# Patient Record
Sex: Female | Born: 1935 | Race: White | Hispanic: No | Marital: Married | Smoking: Never smoker
Health system: Southern US, Community
[De-identification: ages and names within clinical notes are randomized; demographics above are authoritative.]

## PROBLEM LIST (undated history)

## (undated) DIAGNOSIS — M1990 Unspecified osteoarthritis, unspecified site: Secondary | ICD-10-CM

## (undated) DIAGNOSIS — Z9889 Other specified postprocedural states: Secondary | ICD-10-CM

## (undated) DIAGNOSIS — I471 Supraventricular tachycardia, unspecified: Secondary | ICD-10-CM

## (undated) DIAGNOSIS — E049 Nontoxic goiter, unspecified: Secondary | ICD-10-CM

## (undated) DIAGNOSIS — G43009 Migraine without aura, not intractable, without status migrainosus: Secondary | ICD-10-CM

## (undated) DIAGNOSIS — C44519 Basal cell carcinoma of skin of other part of trunk: Secondary | ICD-10-CM

## (undated) DIAGNOSIS — B019 Varicella without complication: Secondary | ICD-10-CM

## (undated) DIAGNOSIS — N83209 Unspecified ovarian cyst, unspecified side: Secondary | ICD-10-CM

## (undated) DIAGNOSIS — J302 Other seasonal allergic rhinitis: Secondary | ICD-10-CM

## (undated) DIAGNOSIS — R112 Nausea with vomiting, unspecified: Secondary | ICD-10-CM

## (undated) DIAGNOSIS — G709 Myoneural disorder, unspecified: Secondary | ICD-10-CM

## (undated) DIAGNOSIS — E7800 Pure hypercholesterolemia, unspecified: Secondary | ICD-10-CM

## (undated) DIAGNOSIS — N814 Uterovaginal prolapse, unspecified: Secondary | ICD-10-CM

## (undated) DIAGNOSIS — I10 Essential (primary) hypertension: Secondary | ICD-10-CM

## (undated) DIAGNOSIS — R42 Dizziness and giddiness: Secondary | ICD-10-CM

## (undated) HISTORY — PX: LAPAROSCOPIC OVARIAN CYSTECTOMY: SHX6248

## (undated) HISTORY — DX: Unspecified osteoarthritis, unspecified site: M19.90

## (undated) HISTORY — DX: Migraine without aura, not intractable, without status migrainosus: G43.009

## (undated) HISTORY — DX: Essential (primary) hypertension: I10

## (undated) HISTORY — DX: Other seasonal allergic rhinitis: J30.2

## (undated) HISTORY — DX: Nontoxic goiter, unspecified: E04.9

## (undated) HISTORY — DX: Supraventricular tachycardia: I47.1

## (undated) HISTORY — DX: Uterovaginal prolapse, unspecified: N81.4

## (undated) HISTORY — DX: Pure hypercholesterolemia, unspecified: E78.00

## (undated) HISTORY — PX: CYST EXCISION: SHX5701

## (undated) HISTORY — DX: Dizziness and giddiness: R42

## (undated) HISTORY — DX: Unspecified ovarian cyst, unspecified side: N83.209

## (undated) HISTORY — DX: Varicella without complication: B01.9

## (undated) HISTORY — PX: DILATION AND CURETTAGE OF UTERUS: SHX78

## (undated) HISTORY — PX: BASAL CELL CARCINOMA EXCISION: SHX1214

## (undated) HISTORY — DX: Supraventricular tachycardia, unspecified: I47.10

---
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
## 1970-10-25 HISTORY — PX: COMBINED HYSTEROSCOPY DIAGNOSTIC / D&C: SUR297

## 1993-10-25 HISTORY — PX: AV NODE ABLATION: SHX1209

## 2005-10-25 DIAGNOSIS — N83209 Unspecified ovarian cyst, unspecified side: Secondary | ICD-10-CM

## 2005-10-25 HISTORY — DX: Unspecified ovarian cyst, unspecified side: N83.209

## 2010-10-25 HISTORY — PX: CATARACT EXTRACTION W/ INTRAOCULAR LENS  IMPLANT, BILATERAL: SHX1307

## 2011-02-03 ENCOUNTER — Other Ambulatory Visit (HOSPITAL_COMMUNITY)
Admission: RE | Admit: 2011-02-03 | Discharge: 2011-02-03 | Disposition: A | Discharge status: Home / Self Care | Payer: Medicare Other | Source: Ambulatory Visit | Attending: Obstetrics and Gynecology | Admitting: Obstetrics and Gynecology

## 2011-02-03 DIAGNOSIS — Z124 Encounter for screening for malignant neoplasm of cervix: Secondary | ICD-10-CM | POA: Insufficient documentation

## 2012-08-04 LAB — HM PAP SMEAR: HM Pap smear: NORMAL

## 2012-11-24 ENCOUNTER — Ambulatory Visit (INDEPENDENT_AMBULATORY_CARE_PROVIDER_SITE_OTHER): Payer: Medicare Other | Admitting: Family Medicine

## 2012-11-24 ENCOUNTER — Ambulatory Visit: Payer: Medicare Other | Admitting: Family Medicine

## 2012-11-24 ENCOUNTER — Encounter: Payer: Self-pay | Admitting: Family Medicine

## 2012-11-24 VITALS — BP 132/84 | HR 67 | Temp 97.7°F | Ht 66.0 in | Wt 168.0 lb

## 2012-11-24 DIAGNOSIS — I10 Essential (primary) hypertension: Secondary | ICD-10-CM

## 2012-11-24 DIAGNOSIS — Z7189 Other specified counseling: Secondary | ICD-10-CM

## 2012-11-24 DIAGNOSIS — IMO0002 Reserved for concepts with insufficient information to code with codable children: Secondary | ICD-10-CM

## 2012-11-24 DIAGNOSIS — M1711 Unilateral primary osteoarthritis, right knee: Secondary | ICD-10-CM | POA: Insufficient documentation

## 2012-11-24 DIAGNOSIS — M1710 Unilateral primary osteoarthritis, unspecified knee: Secondary | ICD-10-CM

## 2012-11-24 DIAGNOSIS — E785 Hyperlipidemia, unspecified: Secondary | ICD-10-CM

## 2012-11-24 DIAGNOSIS — I471 Supraventricular tachycardia: Secondary | ICD-10-CM | POA: Insufficient documentation

## 2012-11-24 DIAGNOSIS — R42 Dizziness and giddiness: Secondary | ICD-10-CM

## 2012-11-24 DIAGNOSIS — I498 Other specified cardiac arrhythmias: Secondary | ICD-10-CM

## 2012-11-24 DIAGNOSIS — R739 Hyperglycemia, unspecified: Secondary | ICD-10-CM

## 2012-11-24 DIAGNOSIS — Z7689 Persons encountering health services in other specified circumstances: Secondary | ICD-10-CM

## 2012-11-24 DIAGNOSIS — R7309 Other abnormal glucose: Secondary | ICD-10-CM

## 2012-11-24 DIAGNOSIS — R609 Edema, unspecified: Secondary | ICD-10-CM

## 2012-11-24 LAB — COMPREHENSIVE METABOLIC PANEL
CO2: 26 mEq/L (ref 19–32)
Calcium: 9 mg/dL (ref 8.4–10.5)
Creatinine, Ser: 0.7 mg/dL (ref 0.4–1.2)
GFR: 90.49 mL/min (ref >60.00)
Glucose, Bld: 88 mg/dL (ref 70–99)
Total Bilirubin: 0.5 mg/dL (ref 0.3–1.2)
Total Protein: 6.7 g/dL (ref 6.0–8.3)

## 2012-11-24 LAB — CBC WITH DIFFERENTIAL/PLATELET
Basophils Relative: 0.7 % (ref 0.0–3.0)
Eosinophils Absolute: 0.2 10*3/uL (ref 0.0–0.7)
HCT: 41 % (ref 36.0–46.0)
Lymphs Abs: 2.6 10*3/uL (ref 0.7–4.0)
MCHC: 33.1 g/dL (ref 30.0–36.0)
MCV: 85.7 fl (ref 78.0–100.0)
Monocytes Absolute: 0.7 10*3/uL (ref 0.1–1.0)
Neutro Abs: 4.2 10*3/uL (ref 1.4–7.7)
Neutrophils Relative %: 54.3 % (ref 43.0–77.0)
RBC: 4.79 Mil/uL (ref 3.87–5.11)

## 2012-11-24 LAB — LIPID PANEL
Cholesterol: 265 mg/dL — ABNORMAL HIGH (ref 0–200)
HDL: 46.8 mg/dL (ref >39.00)
VLDL: 28.4 mg/dL (ref 0.0–40.0)

## 2012-11-24 MED ORDER — MECLIZINE HCL 25 MG PO TABS: 25.0000 mg | ORAL_TABLET | Freq: Three times a day (TID) | ORAL | Status: DC | PRN

## 2012-11-24 NOTE — Patient Instructions (Addendum)
-  We have ordered labs or studies at this visit. It can take up to 1-2 weeks for results and processing. We will contact you with instructions IF your results are abnormal. Normal results will be released to your [facility]. If you have not heard from [place] or can not find your results in [facility] in 2 weeks please contact our office.  -We placed a referral for you as discussed to the cardiologist for a pre-op evaluation. It usually takes about 1-2 weeks to process and schedule this referral. If you have not heard from [place] regarding this appointment in 2 weeks please contact our office.   -PLEASE SIGN UP FOR MYCHART TODAY   We recommend the following healthy lifestyle measures: - eat a healthy diet consisting of lots of vegetables, fruits, beans, nuts, seeds, healthy meats such as white chicken and fish and whole grains.  - avoid fried foods, fast food, processed foods, sodas, red meet and other fattening foods.  - get a least 150 minutes of aerobic exercise per week.   Follow up in: 3 months

## 2012-11-24 NOTE — Progress Notes (Signed)
Chief Complaint  Patient presents with  . Establish Care  . Medical Clearance    HPI:  Leslie Burns is here to establish care. Used to see Dr. Ronne Binning - but she was recently discharged from his practice due to a change in her insurance. Last PCP and physical: last follow up with prior PCP in the fall of 2013, had physical in the summer.  Has the following chronic problems and concerns today:  There is no problem list on file for this patient.  She needs a pre-op visit/evaluation for a R knee replacement surgery. History of SVT and ablation in the past. Has been on atenolol since. She had HTN issues in the past. Rarely in the past she does feel some flutter and palpitations, but has been some time since has been symptomatic with this. She did see a cardiologist a few years back. Gets vertigo occ and was told is middle ear issue and uses meclizine - has been using this very rarely - maybe once a month for years. She also used to take medications for cholesterol - but stopped this and she doesn't want to be on a stating. Denies any history of diabetes, lung disease or kidney disease. Reports sedated for recent cataract surgery, and was sick with nausea afterwards, but no major surgeries since childhood.  Health Maintenance: -refuses flu vaccine -had pneumovax last year, doesn't think had tetanus in last ten years -never had a colonoscopy or any type of colon cancer screening -reports last mammogram 6 years ago -had bone density exam last year and report she had osteopenia - take calcium and vitamin   ROS: See pertinent positives and negatives per HPI.  Past Medical History  Diagnosis Date  . Arthritis   . Chicken pox   . Seasonal allergies   . Hypertension     high readings  . High cholesterol   . Ovarian cyst 2007  . SVT (supraventricular tachycardia)   . Vertigo     Family History  Problem Relation Age of Onset  . Stroke Mother 82  . Heart disease Father 15    MI     History   Social History  . Marital Status: Married    Spouse Name: N/A    Number of Children: N/A  . Years of Education: N/A   Social History Main Topics  . Smoking status: Never Smoker   . Smokeless tobacco: None  . Alcohol Use: No  . Drug Use:   . Sexually Active:    Other Topics Concern  . None   Social History Narrative   Work or School: homemakerHome Situation: lives with husband who has parkinson and dementia - she is the sole caregiverSpiritual Beliefs: christianLifestyle: was exercising until the summer - walks about 1/3 of a mile on a regular basis, only limited by knee pain, can walk of a flight of steps without any SOB    Current outpatient prescriptions:atenolol (TENORMIN) 25 MG tablet, Take 25 mg by mouth daily., Disp: , Rfl: ;  meclizine (ANTIVERT) 25 MG tablet, Take 1 tablet (25 mg total) by mouth 3 (three) times daily as needed., Disp: 30 tablet, Rfl: 0  EXAM:  Filed Vitals:   11/24/12 1253  BP: 132/84  Pulse: 67  Temp: 97.7 F (36.5 C)    Body mass index is 27.12 kg/(m^2).  GENERAL: vitals reviewed and listed above, alert, oriented, appears well hydrated and in no acute distress  HEENT: atraumatic, conjunttiva clear, no obvious abnormalities on inspection of external  nose and ears  NECK: no obvious masses on inspection, no JVD, no bruit  LUNGS: clear to auscultation bilaterally, no wheezes, rales or rhonchi, good air movement  CV: HRRR, 1+ pitting LE edema  MS: moves all extremities without noticeable abnormality  PSYCH: pleasant and cooperative, no obvious depression or anxiety  ASSESSMENT AND PLAN:  Discussed the following assessment and plan:  1. SVT (supraventricular tachycardia)  Ambulatory referral to Cardiology  2. Hypertension  Ambulatory referral to Cardiology, CMP  3. Edema  Ambulatory referral to Cardiology, CMP  4. Osteoarthritis of right knee  Ambulatory referral to Cardiology  5. Vertigo  Ambulatory referral to  Cardiology, CBC with Differential, meclizine (ANTIVERT) 25 MG tablet  6. Hyperlipemia  Lipid Panel  7. Establishing care with new doctor, encounter for  Hemoglobin A1c, CBC with Differential  8. Hyperglycemia  Hemoglobin A1c   -We reviewed the PMH, PSH, FH, SH, Meds and Allergies. -We provided refills for any medications we will prescribe as needed. -Hx of multiple CV issues including HTN, untreated HLD, SVT s/p ablation and FH of CV disease with occ vertigo and palpitations. She also has LE edema which she reports she did not know she had. Advised she see cardiology for a pre-op assessment/eval prior to her knee replacement surgery. -NON-FASTING LABS today -discussed preventive recommendations - she does not want to do a mammogram, colon cancer screening or vaccines -follow up in 3 months  -Patient advised to return or notify a doctor immediately if symptoms worsen or persist or new concerns arise.  Patient Instructions  -We have ordered labs or studies at this visit. It can take up to 1-2 weeks for results and processing. We will contact you with instructions IF your results are abnormal. Normal results will be released to your [facility]. If you have not heard from [place] or can not find your results in [facility] in 2 weeks please contact our office.  -We placed a referral for you as discussed to the cardiologist for a pre-op evaluation. It usually takes about 1-2 weeks to process and schedule this referral. If you have not heard from [place] regarding this appointment in 2 weeks please contact our office.   -PLEASE SIGN UP FOR MYCHART TODAY   We recommend the following healthy lifestyle measures: - eat a healthy diet consisting of lots of vegetables, fruits, beans, nuts, seeds, healthy meats such as white chicken and fish and whole grains.  - avoid fried foods, fast food, processed foods, sodas, red meet and other fattening foods.  - get a least 150 minutes of aerobic exercise per week.   Follow up  in: 3 months      Treena Cosman R.

## 2012-11-25 ENCOUNTER — Telehealth: Payer: Self-pay | Admitting: Family Medicine

## 2012-11-25 NOTE — Telephone Encounter (Signed)
Please let her know:  Cholesterol is high - she can discuss with the cardiologist or with me at follow up  Otherwise, labs look ok.

## 2012-11-28 ENCOUNTER — Encounter: Payer: Self-pay | Admitting: Family Medicine

## 2012-11-28 NOTE — Telephone Encounter (Signed)
Left message on voicemail.

## 2012-11-29 NOTE — Telephone Encounter (Signed)
Called and spoke with pt and pt is aware.  Pt request a copy be sent to home address (address verified).

## 2012-11-29 NOTE — Telephone Encounter (Signed)
Called and spoke with pt and pt is aware.  

## 2012-12-06 ENCOUNTER — Encounter: Payer: Self-pay | Admitting: Internal Medicine

## 2012-12-06 ENCOUNTER — Ambulatory Visit (INDEPENDENT_AMBULATORY_CARE_PROVIDER_SITE_OTHER): Payer: Medicare Other | Admitting: Internal Medicine

## 2012-12-06 VITALS — BP 168/78 | HR 65 | Ht 66.0 in | Wt 168.2 lb

## 2012-12-06 DIAGNOSIS — Z01810 Encounter for preprocedural cardiovascular examination: Secondary | ICD-10-CM

## 2012-12-06 DIAGNOSIS — I498 Other specified cardiac arrhythmias: Secondary | ICD-10-CM

## 2012-12-06 DIAGNOSIS — I4949 Other premature depolarization: Secondary | ICD-10-CM

## 2012-12-06 DIAGNOSIS — I471 Supraventricular tachycardia, unspecified: Secondary | ICD-10-CM

## 2012-12-06 DIAGNOSIS — I493 Ventricular premature depolarization: Secondary | ICD-10-CM | POA: Insufficient documentation

## 2012-12-06 NOTE — Assessment & Plan Note (Signed)
Status postcatheter ablation 1990s without clinical recurrences this issue is resolved

## 2012-12-06 NOTE — Assessment & Plan Note (Signed)
Patient's surgery is intermediate risk. Her functional status is good. She is on beta blockers. No history of coronary disease. Her surgical risks should be acceptable. We will be available.

## 2012-12-06 NOTE — Assessment & Plan Note (Signed)
Asymptomatic continue beta blocker

## 2012-12-06 NOTE — Progress Notes (Signed)
ELECTROPHYSIOLOGY CONSULT NOTE  Patient ID: Leslie Burns, MRN: [id number], DOB/AGE: Jun 28, 1933 79 y.o. Admit date: (Not on file) Date of Consult: 12/06/2012  Primary Physician: Terressa Koyanagi., DO Primary Cardiologist: new   Chief Complaint:  preop clearance    HPI Leslie Burns is a 79 y.o. female  Seen for preoperative clearance. She is to have knee replacement surgery. She is functionally quite able. She is able to climb stairs, empty  car groceries. She has no complaints of chest pain or shortness of breath with this.   She is s/p AV node reentry tachycardia catheter ablation done at [facility] in the 1990s without recurrent tachycardia arrhythmia. She has had palpitations. Holter monitoring at [facility] demonstrated PACs and PVCs and she has been treated long-term with beta blockers because of this.     Past Medical History  Diagnosis Date  . Arthritis   . Chicken pox   . Seasonal allergies   . Hypertension     high readings  . High cholesterol   . Ovarian cyst 2007  . SVT (supraventricular tachycardia)   . Vertigo       Surgical History:  Past Surgical History  Procedure Laterality Date  . Tonsillectomy and adenoidectomy  1945  . Combined hysteroscopy diagnostic / d&c  1958  . Combined hysteroscopy diagnostic / d&c  1972    tubal pregnancy  . Av nodel ablation  1995  . Double cataract surgery  2012     Home Meds: Prior to Admission medications   Medication Sig Start Date End Date Taking? Authorizing Provider  atenolol (TENORMIN) 25 MG tablet Take 25 mg by mouth daily.   Yes Historical Provider, MD  meclizine (ANTIVERT) 25 MG tablet Take 1 tablet (25 mg total) by mouth 3 (three) times daily as needed. 11/24/12  Yes Terressa Koyanagi, DO   Allergies:  Allergies  Allergen Reactions  . Azithromycin     History   Social History  . Marital Status: Married    Spouse Name: N/A    Number of Children: N/A  . Years of Education: N/A   Occupational History  . Not on  file.   Social History Main Topics  . Smoking status: Never Smoker   . Smokeless tobacco: Not on file  . Alcohol Use: No  . Drug Use:   . Sexually Active:    Other Topics Concern  . Not on file   Social History Narrative   Work or School: homemaker      Home Situation: lives with husband who has parkinson and dementia - she is the sole caregiver      Spiritual Beliefs: christian      Lifestyle: was exercising until the summer - walks about 1/3 of a mile on a regular basis, only limited by knee pain, can walk of a flight of steps without any SOB              Family History  Problem Relation Age of Onset  . Stroke Mother 31  . Heart disease Father 43    MI  . Alcohol abuse      parent  . Alcohol abuse      grandparent  . Arthritis      parent  . Hyperlipidemia      parent  . Hypertension      parent     ROS:  Please see the history of present illness.     All other systems reviewed and negative.  Physical Exam:   Blood pressure 146/67, pulse 62, height 5\' 6"  (1.676 m), weight 168 lb 3.2 oz (76.295 kg). General: Well developed, well nourished female in no acute distress. Head: Normocephalic, atraumatic, sclera non-icteric, no xanthomas, nares are without discharge. Lymph Nodes:  none Back: without scoliosis/kyphosis , no CVA tendersness Neck: Negative for carotid bruits. JVD not elevated. Lungs: Clear bilaterally to auscultation without wheezes, rales, or rhonchi. Breathing is unlabored. Heart: RRR with S1 S2. No  murmur , rubs, or gallops appreciated. Abdomen: Soft, non-tender, non-distended with normoactive bowel sounds. No hepatomegaly. No rebound/guarding. No obvious abdominal masses. Msk:  Strength and tone appear normal for age. Extremities: No clubbing or cyanosis. No  edema.  Distal pedal pulses are 2+ and equal bilaterally. Wearing a brace on ight knee and I Skin: Warm and Dry Neuro: Alert and oriented X 3. CN III-XII intact Grossly normal sensory and  motor function . Psych:  Responds to questions appropriately with a normal affect.      Labs: Cardiac Enzymes No results found for this basename: CKTOTAL, CKMB, TROPONINI,  in the last 72 hours CBC Lab Results  Component Value Date   WBC 7.8 11/24/2012   HGB 13.6 11/24/2012   HCT 41.0 11/24/2012   MCV 85.7 11/24/2012   PLT 266.0 11/24/2012   PROTIME: No results found for this basename: LABPROT, INR,  in the last 72 hours Chemistry No results found for this basename: NA, K, CL, CO2, BUN, CREATININE, CALCIUM, LABALBU, PROT, BILITOT, ALKPHOS, ALT, AST, GLUCOSE,  in the last 168 hours Lipids Lab Results  Component Value Date   CHOL 265* 11/24/2012   HDL 46.80 11/24/2012   TRIG 142.0 11/24/2012   BNP No results found for this basename: probnp   Miscellaneous No results found for this basename: DDIMER    Radiology/Studies:  No results found.  EKG:  Of sinus rhythm at 58 14/15/44 Axis is t normal at 47   Assessment and Plan:    Sherryl Manges

## 2012-12-20 ENCOUNTER — Encounter: Payer: Self-pay | Admitting: Family Medicine

## 2012-12-20 NOTE — Progress Notes (Signed)
OV notes from [facility] scanned in. Health maintenance updated.

## 2013-01-02 ENCOUNTER — Encounter: Payer: Self-pay | Admitting: Family Medicine

## 2013-01-08 ENCOUNTER — Encounter (HOSPITAL_COMMUNITY): Payer: Self-pay | Admitting: Pharmacy Technician

## 2013-01-12 ENCOUNTER — Other Ambulatory Visit: Payer: Self-pay | Admitting: Orthopedic Surgery

## 2013-01-13 NOTE — Pre-Procedure Instructions (Signed)
Leslie Burns  01/13/2013   Your procedure is scheduled on:  Mon, Mar 31 @ 7:15 AM  Report to [facility] at 5:30 AM.  Call this number if you have problems the morning of surgery: [phone]   Remember:   Do not eat food or drink liquids after midnight.   Take these medicines the morning of surgery with A SIP OF WATER: Atenolol(Tenormin) and Antivert(Meclizine--if needed)   Do not wear jewelry, make-up or nail polish.  Do not wear lotions, powders, or perfumes. You may wear deodorant.  Do not shave 48 hours prior to surgery.   Do not bring valuables to the hospital.  Contacts, dentures or bridgework may not be worn into surgery.  Leave suitcase in the car. After surgery it may be brought to your room.  For patients admitted to the hospital, checkout time is 11:00 AM the day of  discharge.   Patients discharged the day of surgery will not be allowed to drive  home.    Special Instructions: Shower using CHG 2 nights before surgery and the night before surgery.  If you shower the day of surgery use CHG.  Use special wash - you have one bottle of CHG for all showers.  You should use approximately 1/3 of the bottle for each shower.   Please read over the following fact sheets that you were given: Pain Booklet, Coughing and Deep Breathing, Blood Transfusion Information, Total Joint Packet, MRSA Information and Surgical Site Infection Prevention

## 2013-01-15 ENCOUNTER — Encounter (HOSPITAL_COMMUNITY): Payer: Self-pay

## 2013-01-15 ENCOUNTER — Encounter (HOSPITAL_COMMUNITY)
Admission: RE | Admit: 2013-01-15 | Discharge: 2013-01-15 | Disposition: A | Discharge status: Home / Self Care | Payer: Medicare Other | Source: Ambulatory Visit | Attending: Orthopedic Surgery | Admitting: Orthopedic Surgery

## 2013-01-15 HISTORY — DX: Myoneural disorder, unspecified: G70.9

## 2013-01-15 HISTORY — DX: Nausea with vomiting, unspecified: R11.2

## 2013-01-15 HISTORY — DX: Other specified postprocedural states: Z98.890

## 2013-01-15 LAB — PROTIME-INR: INR: 1.05 (ref 0.00–1.49)

## 2013-01-15 LAB — URINALYSIS, ROUTINE W REFLEX MICROSCOPIC
Glucose, UA: NEGATIVE mg/dL
Ketones, ur: NEGATIVE mg/dL
Nitrite: NEGATIVE
Specific Gravity, Urine: 1.007 (ref 1.005–1.030)
pH: 7 (ref 5.0–8.0)

## 2013-01-15 LAB — COMPREHENSIVE METABOLIC PANEL
ALT: 10 U/L (ref 0–35)
AST: 13 U/L (ref 0–37)
Albumin: 3.6 g/dL (ref 3.5–5.2)
CO2: 26 mEq/L (ref 19–32)
Calcium: 9.2 mg/dL (ref 8.4–10.5)
Chloride: 106 mEq/L (ref 96–112)
Creatinine, Ser: 0.67 mg/dL (ref 0.50–1.10)
GFR calc non Af Amer: 82 mL/min — ABNORMAL LOW (ref >90)
Sodium: 141 mEq/L (ref 135–145)
Total Bilirubin: 0.3 mg/dL (ref 0.3–1.2)

## 2013-01-15 LAB — SURGICAL PCR SCREEN
MRSA, PCR: NEGATIVE
Staphylococcus aureus: NEGATIVE

## 2013-01-15 LAB — CBC WITH DIFFERENTIAL/PLATELET
Basophils Absolute: 0 10*3/uL (ref 0.0–0.1)
Basophils Relative: 1 % (ref 0–1)
Lymphocytes Relative: 43 % (ref 12–46)
MCHC: 33.5 g/dL (ref 30.0–36.0)
Neutro Abs: 3.5 10*3/uL (ref 1.7–7.7)
Platelets: 250 10*3/uL (ref 150–400)
RDW: 13.1 % (ref 11.5–15.5)
WBC: 7.4 10*3/uL (ref 4.0–10.5)

## 2013-01-15 LAB — APTT: aPTT: 30 seconds (ref 24–37)

## 2013-01-15 LAB — URINE MICROSCOPIC-ADD ON

## 2013-01-15 LAB — ABO/RH: ABO/RH(D): O POS

## 2013-01-15 LAB — TYPE AND SCREEN

## 2013-01-17 LAB — URINE CULTURE: Colony Count: >=100000

## 2013-01-21 MED ORDER — CEFAZOLIN SODIUM-DEXTROSE 2-3 GM-% IV SOLR
2.0000 g | INTRAVENOUS | Status: AC
  Administered 2013-01-22: 2 g via INTRAVENOUS
  Filled 2013-01-21: qty 50

## 2013-01-22 ENCOUNTER — Encounter (HOSPITAL_COMMUNITY): Payer: Self-pay

## 2013-01-22 ENCOUNTER — Inpatient Hospital Stay (HOSPITAL_COMMUNITY)
Admission: RE | Admit: 2013-01-22 | Discharge: 2013-01-23 | DRG: 470 | Disposition: A | Discharge status: Home Health | Payer: Medicare Other | Source: Ambulatory Visit | Attending: Orthopedic Surgery | Admitting: Orthopedic Surgery

## 2013-01-22 ENCOUNTER — Encounter (HOSPITAL_COMMUNITY): Payer: Self-pay | Admitting: Certified Registered"

## 2013-01-22 ENCOUNTER — Encounter (HOSPITAL_COMMUNITY)
Admission: RE | Disposition: A | Discharge status: Home Health | Payer: Self-pay | Source: Ambulatory Visit | Attending: Orthopedic Surgery

## 2013-01-22 ENCOUNTER — Inpatient Hospital Stay (HOSPITAL_COMMUNITY): Payer: Medicare Other | Admitting: Certified Registered"

## 2013-01-22 DIAGNOSIS — E7800 Pure hypercholesterolemia, unspecified: Secondary | ICD-10-CM | POA: Diagnosis present

## 2013-01-22 DIAGNOSIS — Z823 Family history of stroke: Secondary | ICD-10-CM

## 2013-01-22 DIAGNOSIS — Z6379 Other stressful life events affecting family and household: Secondary | ICD-10-CM

## 2013-01-22 DIAGNOSIS — Z888 Allergy status to other drugs, medicaments and biological substances status: Secondary | ICD-10-CM

## 2013-01-22 DIAGNOSIS — Z7901 Long term (current) use of anticoagulants: Secondary | ICD-10-CM

## 2013-01-22 DIAGNOSIS — Z881 Allergy status to other antibiotic agents status: Secondary | ICD-10-CM

## 2013-01-22 DIAGNOSIS — Z8249 Family history of ischemic heart disease and other diseases of the circulatory system: Secondary | ICD-10-CM

## 2013-01-22 DIAGNOSIS — M1710 Unilateral primary osteoarthritis, unspecified knee: Principal | ICD-10-CM | POA: Diagnosis present

## 2013-01-22 DIAGNOSIS — M179 Osteoarthritis of knee, unspecified: Secondary | ICD-10-CM

## 2013-01-22 DIAGNOSIS — Z8261 Family history of arthritis: Secondary | ICD-10-CM

## 2013-01-22 DIAGNOSIS — Z01812 Encounter for preprocedural laboratory examination: Secondary | ICD-10-CM

## 2013-01-22 DIAGNOSIS — Z79899 Other long term (current) drug therapy: Secondary | ICD-10-CM

## 2013-01-22 DIAGNOSIS — I10 Essential (primary) hypertension: Secondary | ICD-10-CM | POA: Diagnosis present

## 2013-01-22 DIAGNOSIS — D62 Acute posthemorrhagic anemia: Secondary | ICD-10-CM | POA: Diagnosis not present

## 2013-01-22 DIAGNOSIS — I491 Atrial premature depolarization: Secondary | ICD-10-CM | POA: Diagnosis present

## 2013-01-22 HISTORY — PX: TOTAL KNEE ARTHROPLASTY: SHX125

## 2013-01-22 HISTORY — DX: Basal cell carcinoma of skin of other part of trunk: C44.519

## 2013-01-22 LAB — CREATININE, SERUM
GFR calc Af Amer: >90 mL/min (ref >90)
GFR calc non Af Amer: 84 mL/min — ABNORMAL LOW (ref >90)

## 2013-01-22 LAB — CBC
HCT: 39.1 % (ref 36.0–46.0)
Hemoglobin: 13.2 g/dL (ref 12.0–15.0)
MCV: 84.1 fL (ref 78.0–100.0)
RBC: 4.65 MIL/uL (ref 3.87–5.11)
RDW: 13.1 % (ref 11.5–15.5)
WBC: 15.2 10*3/uL — ABNORMAL HIGH (ref 4.0–10.5)

## 2013-01-22 SURGERY — ARTHROPLASTY, KNEE, TOTAL
Anesthesia: General | Site: Knee | Laterality: Right | Wound class: Clean

## 2013-01-22 MED ORDER — FENTANYL CITRATE 0.05 MG/ML IJ SOLN: 50.0000 ug | Freq: Once | INTRAMUSCULAR | Status: DC

## 2013-01-22 MED ORDER — CEFAZOLIN SODIUM 1-5 GM-% IV SOLN
1.0000 g | Freq: Four times a day (QID) | INTRAVENOUS | Status: AC
  Administered 2013-01-22 (×2): 1 g via INTRAVENOUS
  Filled 2013-01-22 (×2): qty 50

## 2013-01-22 MED ORDER — HYDROMORPHONE HCL PF 1 MG/ML IJ SOLN: 0.2500 mg | INTRAMUSCULAR | Status: DC | PRN

## 2013-01-22 MED ORDER — SODIUM CHLORIDE 0.9 % IV SOLN: INTRAVENOUS | Status: DC

## 2013-01-22 MED ORDER — MIDAZOLAM HCL 2 MG/2ML IJ SOLN: 1.0000 mg | INTRAMUSCULAR | Status: DC | PRN

## 2013-01-22 MED ORDER — BUPIVACAINE-EPINEPHRINE PF 0.5-1:200000 % IJ SOLN
INTRAMUSCULAR | Status: DC | PRN
  Administered 2013-01-22: 25 mL

## 2013-01-22 MED ORDER — SENNOSIDES-DOCUSATE SODIUM 8.6-50 MG PO TABS: 1.0000 | ORAL_TABLET | Freq: Every evening | ORAL | Status: DC | PRN

## 2013-01-22 MED ORDER — BUPIVACAINE-EPINEPHRINE 0.25% -1:200000 IJ SOLN
INTRAMUSCULAR | Status: AC
  Filled 2013-01-22: qty 1

## 2013-01-22 MED ORDER — PROPOFOL 10 MG/ML IV BOLUS
INTRAVENOUS | Status: DC | PRN
  Administered 2013-01-22: 140 mg via INTRAVENOUS

## 2013-01-22 MED ORDER — ATENOLOL 25 MG PO TABS
25.0000 mg | ORAL_TABLET | Freq: Every day | ORAL | Status: DC
  Administered 2013-01-23: 25 mg via ORAL
  Filled 2013-01-22: qty 1

## 2013-01-22 MED ORDER — PHENYLEPHRINE HCL 10 MG/ML IJ SOLN
INTRAMUSCULAR | Status: DC | PRN
  Administered 2013-01-22 (×2): 80 ug via INTRAVENOUS

## 2013-01-22 MED ORDER — BUPIVACAINE 0.25 % ON-Q PUMP SINGLE CATH 300ML
300.0000 mL | INJECTION | Status: DC
  Filled 2013-01-22 (×2): qty 300

## 2013-01-22 MED ORDER — LACTATED RINGERS IV SOLN
INTRAVENOUS | Status: DC | PRN
  Administered 2013-01-22 (×2): via INTRAVENOUS

## 2013-01-22 MED ORDER — DEXAMETHASONE SODIUM PHOSPHATE 10 MG/ML IJ SOLN
10.0000 mg | Freq: Three times a day (TID) | INTRAMUSCULAR | Status: AC
  Administered 2013-01-22: 10 mg via INTRAVENOUS
  Filled 2013-01-22 (×3): qty 1

## 2013-01-22 MED ORDER — SCOPOLAMINE 1 MG/3DAYS TD PT72
MEDICATED_PATCH | TRANSDERMAL | Status: AC
  Administered 2013-01-22: 1 via TRANSDERMAL
  Filled 2013-01-22: qty 1

## 2013-01-22 MED ORDER — PROMETHAZINE HCL 25 MG/ML IJ SOLN
6.2500 mg | INTRAMUSCULAR | Status: DC | PRN
  Administered 2013-01-22: 6.25 mg via INTRAVENOUS

## 2013-01-22 MED ORDER — BUPIVACAINE-EPINEPHRINE 0.25% -1:200000 IJ SOLN
INTRAMUSCULAR | Status: DC | PRN
  Administered 2013-01-22: 25 mL

## 2013-01-22 MED ORDER — ACETAMINOPHEN 10 MG/ML IV SOLN
INTRAVENOUS | Status: AC
  Administered 2013-01-22: 1000 mg via INTRAVENOUS
  Filled 2013-01-22: qty 100

## 2013-01-22 MED ORDER — METOCLOPRAMIDE HCL 5 MG/ML IJ SOLN
5.0000 mg | Freq: Three times a day (TID) | INTRAMUSCULAR | Status: DC | PRN
  Administered 2013-01-22: 10 mg via INTRAVENOUS
  Filled 2013-01-22 (×2): qty 2

## 2013-01-22 MED ORDER — ZOLPIDEM TARTRATE 5 MG PO TABS: 5.0000 mg | ORAL_TABLET | Freq: Every evening | ORAL | Status: DC | PRN

## 2013-01-22 MED ORDER — CHLORHEXIDINE GLUCONATE 4 % EX LIQD: 60.0000 mL | Freq: Once | CUTANEOUS | Status: DC

## 2013-01-22 MED ORDER — ONDANSETRON HCL 4 MG PO TABS
4.0000 mg | ORAL_TABLET | Freq: Four times a day (QID) | ORAL | Status: DC | PRN
  Administered 2013-01-22: 4 mg via ORAL
  Filled 2013-01-22: qty 1

## 2013-01-22 MED ORDER — SCOPOLAMINE 1 MG/3DAYS TD PT72: 1.0000 | MEDICATED_PATCH | Freq: Once | TRANSDERMAL | Status: DC

## 2013-01-22 MED ORDER — SODIUM CHLORIDE 0.9 % IR SOLN
Status: DC | PRN
  Administered 2013-01-22: 1000 mL
  Administered 2013-01-22: 3000 mL

## 2013-01-22 MED ORDER — PHENOL 1.4 % MT LIQD: 1.0000 | OROMUCOSAL | Status: DC | PRN

## 2013-01-22 MED ORDER — MENTHOL 3 MG MT LOZG: 1.0000 | LOZENGE | OROMUCOSAL | Status: DC | PRN

## 2013-01-22 MED ORDER — ACETAMINOPHEN 10 MG/ML IV SOLN
1000.0000 mg | Freq: Four times a day (QID) | INTRAVENOUS | Status: AC
Start: 2013-01-22 — End: 2013-01-23
  Administered 2013-01-22 – 2013-01-23 (×4): 1000 mg via INTRAVENOUS
  Filled 2013-01-22 (×5): qty 100

## 2013-01-22 MED ORDER — OXYCODONE HCL 5 MG PO TABS
5.0000 mg | ORAL_TABLET | ORAL | Status: DC | PRN
  Administered 2013-01-23: 5 mg via ORAL
  Filled 2013-01-22: qty 1

## 2013-01-22 MED ORDER — ONDANSETRON HCL 4 MG/2ML IJ SOLN
INTRAMUSCULAR | Status: DC | PRN
  Administered 2013-01-22: 4 mg via INTRAVENOUS

## 2013-01-22 MED ORDER — CELECOXIB 200 MG PO CAPS
200.0000 mg | ORAL_CAPSULE | Freq: Two times a day (BID) | ORAL | Status: DC
  Administered 2013-01-22 – 2013-01-23 (×2): 200 mg via ORAL
  Filled 2013-01-22 (×4): qty 1

## 2013-01-22 MED ORDER — ACETAMINOPHEN 650 MG RE SUPP: 650.0000 mg | Freq: Four times a day (QID) | RECTAL | Status: DC | PRN

## 2013-01-22 MED ORDER — ENOXAPARIN SODIUM 30 MG/0.3ML ~~LOC~~ SOLN
30.0000 mg | Freq: Two times a day (BID) | SUBCUTANEOUS | Status: DC
  Administered 2013-01-23: 30 mg via SUBCUTANEOUS
  Filled 2013-01-22 (×3): qty 0.3

## 2013-01-22 MED ORDER — MIDAZOLAM HCL 5 MG/5ML IJ SOLN
INTRAMUSCULAR | Status: DC | PRN
  Administered 2013-01-22 (×2): 1 mg via INTRAVENOUS

## 2013-01-22 MED ORDER — METOCLOPRAMIDE HCL 5 MG PO TABS
5.0000 mg | ORAL_TABLET | Freq: Three times a day (TID) | ORAL | Status: DC | PRN
  Filled 2013-01-22: qty 2

## 2013-01-22 MED ORDER — PROMETHAZINE HCL 25 MG/ML IJ SOLN
INTRAMUSCULAR | Status: AC
  Filled 2013-01-22: qty 1

## 2013-01-22 MED ORDER — MECLIZINE HCL 12.5 MG PO TABS
12.5000 mg | ORAL_TABLET | Freq: Three times a day (TID) | ORAL | Status: DC | PRN
  Filled 2013-01-22: qty 1

## 2013-01-22 MED ORDER — DOCUSATE SODIUM 100 MG PO CAPS
100.0000 mg | ORAL_CAPSULE | Freq: Two times a day (BID) | ORAL | Status: DC
  Administered 2013-01-22 – 2013-01-23 (×2): 100 mg via ORAL
  Filled 2013-01-22 (×2): qty 1

## 2013-01-22 MED ORDER — SUFENTANIL CITRATE 50 MCG/ML IV SOLN
INTRAVENOUS | Status: DC | PRN
  Administered 2013-01-22: 10 ug via INTRAVENOUS
  Administered 2013-01-22 (×3): 5 ug via INTRAVENOUS

## 2013-01-22 MED ORDER — METHOCARBAMOL 500 MG PO TABS
500.0000 mg | ORAL_TABLET | Freq: Four times a day (QID) | ORAL | Status: DC | PRN
  Administered 2013-01-22 – 2013-01-23 (×2): 500 mg via ORAL
  Filled 2013-01-22 (×3): qty 1

## 2013-01-22 MED ORDER — DEXAMETHASONE 6 MG PO TABS
10.0000 mg | ORAL_TABLET | Freq: Three times a day (TID) | ORAL | Status: AC
  Administered 2013-01-22 – 2013-01-23 (×2): 10 mg via ORAL
  Filled 2013-01-22 (×3): qty 1

## 2013-01-22 MED ORDER — ALUM & MAG HYDROXIDE-SIMETH 200-200-20 MG/5ML PO SUSP: 30.0000 mL | ORAL | Status: DC | PRN

## 2013-01-22 MED ORDER — ACETAMINOPHEN 325 MG PO TABS: 650.0000 mg | ORAL_TABLET | Freq: Four times a day (QID) | ORAL | Status: DC | PRN

## 2013-01-22 MED ORDER — BUPIVACAINE 0.25 % ON-Q PUMP SINGLE CATH 300ML: INJECTION | Status: DC | PRN

## 2013-01-22 MED ORDER — METHOCARBAMOL 100 MG/ML IJ SOLN
500.0000 mg | Freq: Four times a day (QID) | INTRAVENOUS | Status: DC | PRN
  Administered 2013-01-22: 500 mg via INTRAVENOUS
  Filled 2013-01-22 (×2): qty 5

## 2013-01-22 MED ORDER — LIDOCAINE HCL (CARDIAC) 20 MG/ML IV SOLN
INTRAVENOUS | Status: DC | PRN
  Administered 2013-01-22: 60 mg via INTRAVENOUS

## 2013-01-22 MED ORDER — ONDANSETRON HCL 4 MG/2ML IJ SOLN
4.0000 mg | Freq: Four times a day (QID) | INTRAMUSCULAR | Status: DC | PRN
  Administered 2013-01-22: 4 mg via INTRAVENOUS
  Filled 2013-01-22: qty 2

## 2013-01-22 MED ORDER — HYDROMORPHONE HCL PF 1 MG/ML IJ SOLN
1.0000 mg | INTRAMUSCULAR | Status: DC | PRN
  Administered 2013-01-22 – 2013-01-23 (×2): 1 mg via INTRAVENOUS
  Filled 2013-01-22 (×2): qty 1

## 2013-01-22 MED ORDER — FLEET ENEMA 7-19 GM/118ML RE ENEM: 1.0000 | ENEMA | Freq: Once | RECTAL | Status: AC | PRN

## 2013-01-22 MED ORDER — BISACODYL 5 MG PO TBEC: 5.0000 mg | DELAYED_RELEASE_TABLET | Freq: Every day | ORAL | Status: DC | PRN

## 2013-01-22 MED ORDER — DIPHENHYDRAMINE HCL 12.5 MG/5ML PO ELIX: 12.5000 mg | ORAL_SOLUTION | ORAL | Status: DC | PRN

## 2013-01-22 MED ORDER — GLYCOPYRROLATE 0.2 MG/ML IJ SOLN
INTRAMUSCULAR | Status: DC | PRN
  Administered 2013-01-22 (×2): 0.2 mg via INTRAVENOUS

## 2013-01-22 SURGICAL SUPPLY — 57 items
BANDAGE ESMARK 6X9 LF (GAUZE/BANDAGES/DRESSINGS) ×1 IMPLANT
BLADE SAGITTAL 13X1.27X60 (BLADE) ×2 IMPLANT
BLADE SAW SGTL 83.5X18.5 (BLADE) ×2 IMPLANT
BNDG ESMARK 6X9 LF (GAUZE/BANDAGES/DRESSINGS) ×2
BOWL SMART MIX CTS (DISPOSABLE) ×2 IMPLANT
CATH KIT ON Q 5IN SLV (PAIN MANAGEMENT) ×2 IMPLANT
CEMENT BONE SIMPLEX SPEEDSET (Cement) ×4 IMPLANT
CLOTH BEACON ORANGE TIMEOUT ST (SAFETY) ×2 IMPLANT
COVER SURGICAL LIGHT HANDLE (MISCELLANEOUS) ×2 IMPLANT
CUFF TOURNIQUET SINGLE 34IN LL (TOURNIQUET CUFF) ×2 IMPLANT
DECANTER SPIKE VIAL GLASS SM (MISCELLANEOUS) ×2 IMPLANT
DRAPE EXTREMITY T 121X128X90 (DRAPE) ×2 IMPLANT
DRAPE INCISE IOBAN 66X45 STRL (DRAPES) ×4 IMPLANT
DRAPE PROXIMA HALF (DRAPES) ×2 IMPLANT
DRAPE U-SHAPE 47X51 STRL (DRAPES) ×2 IMPLANT
DRSG ADAPTIC 3X8 NADH LF (GAUZE/BANDAGES/DRESSINGS) ×2 IMPLANT
DRSG PAD ABDOMINAL 8X10 ST (GAUZE/BANDAGES/DRESSINGS) ×2 IMPLANT
DURAPREP 26ML APPLICATOR (WOUND CARE) ×4 IMPLANT
ELECT REM PT RETURN 9FT ADLT (ELECTROSURGICAL) ×2
ELECTRODE REM PT RTRN 9FT ADLT (ELECTROSURGICAL) ×1 IMPLANT
EVACUATOR 1/8 PVC DRAIN (DRAIN) ×2 IMPLANT
GLOVE BIOGEL M 7.0 STRL (GLOVE) IMPLANT
GLOVE BIOGEL PI IND STRL 7.5 (GLOVE) IMPLANT
GLOVE BIOGEL PI IND STRL 8.5 (GLOVE) ×1 IMPLANT
GLOVE BIOGEL PI INDICATOR 7.5 (GLOVE)
GLOVE BIOGEL PI INDICATOR 8.5 (GLOVE) ×1
GLOVE BIOGEL PI ORTHO PRO SZ8 (GLOVE) ×1
GLOVE PI ORTHO PRO STRL SZ8 (GLOVE) ×1 IMPLANT
GLOVE SURG ORTHO 8.0 STRL STRW (GLOVE) ×4 IMPLANT
GOWN PREVENTION PLUS XLARGE (GOWN DISPOSABLE) ×6 IMPLANT
GOWN STRL NON-REIN LRG LVL3 (GOWN DISPOSABLE) IMPLANT
HANDPIECE INTERPULSE COAX TIP (DISPOSABLE) ×1
HOOD PEEL AWAY FACE SHEILD DIS (HOOD) ×6 IMPLANT
KIT BASIN OR (CUSTOM PROCEDURE TRAY) ×2 IMPLANT
KIT ROOM TURNOVER OR (KITS) ×2 IMPLANT
MANIFOLD NEPTUNE II (INSTRUMENTS) ×2 IMPLANT
NEEDLE 22X1 1/2 (OR ONLY) (NEEDLE) ×2 IMPLANT
NS IRRIG 1000ML POUR BTL (IV SOLUTION) ×2 IMPLANT
PACK TOTAL JOINT (CUSTOM PROCEDURE TRAY) ×2 IMPLANT
PAD ARMBOARD 7.5X6 YLW CONV (MISCELLANEOUS) ×4 IMPLANT
PADDING CAST COTTON 6X4 STRL (CAST SUPPLIES) ×2 IMPLANT
POSITIONER HEAD PRONE TRACH (MISCELLANEOUS) ×2 IMPLANT
SET HNDPC FAN SPRY TIP SCT (DISPOSABLE) ×1 IMPLANT
SPONGE GAUZE 4X4 12PLY (GAUZE/BANDAGES/DRESSINGS) ×2 IMPLANT
STAPLER VISISTAT 35W (STAPLE) ×2 IMPLANT
SUCTION FRAZIER TIP 10 FR DISP (SUCTIONS) IMPLANT
SUT BONE WAX W31G (SUTURE) ×2 IMPLANT
SUT VIC AB 0 CTB1 27 (SUTURE) ×4 IMPLANT
SUT VIC AB 1 CT1 27 (SUTURE) ×3
SUT VIC AB 1 CT1 27XBRD ANBCTR (SUTURE) ×3 IMPLANT
SUT VIC AB 2-0 CT1 27 (SUTURE) ×2
SUT VIC AB 2-0 CT1 TAPERPNT 27 (SUTURE) ×2 IMPLANT
SYR CONTROL 10ML LL (SYRINGE) IMPLANT
TOWEL OR 17X24 6PK STRL BLUE (TOWEL DISPOSABLE) ×2 IMPLANT
TOWEL OR 17X26 10 PK STRL BLUE (TOWEL DISPOSABLE) ×2 IMPLANT
TRAY FOLEY CATH 14FR (SET/KITS/TRAYS/PACK) ×2 IMPLANT
WATER STERILE IRR 1000ML POUR (IV SOLUTION) ×2 IMPLANT

## 2013-01-22 NOTE — Preoperative (Signed)
Beta Blockers   Reason not to administer Beta Blockers:Atenolol taken by patient at 0430 hrs, 01/22/2013

## 2013-01-22 NOTE — Anesthesia Postprocedure Evaluation (Signed)
  Anesthesia Post-op Note  Patient: Leslie Burns  Procedure(s) Performed: Procedure(s): TOTAL KNEE ARTHROPLASTY (Right)  Patient Location: PACU  Anesthesia Type:General and GA combined with regional for post-op pain  Level of Consciousness: awake and alert   Airway and Oxygen Therapy: Patient Spontanous Breathing  Post-op Pain: mild  Post-op Assessment: Post-op Vital signs reviewed, Patient's Cardiovascular Status Stable, Respiratory Function Stable, Patent Airway, No signs of Nausea or vomiting and Pain level controlled  Post-op Vital Signs: stable  Complications: No apparent anesthesia complications

## 2013-01-22 NOTE — Transfer of Care (Signed)
Immediate Anesthesia Transfer of Care Note  Patient: Leslie Burns  Procedure(s) Performed: Procedure(s): TOTAL KNEE ARTHROPLASTY (Right)  Patient Location: PACU  Anesthesia Type:GA combined with regional for post-op pain  Level of Consciousness: awake  Airway & Oxygen Therapy: Patient Spontanous Breathing and Patient connected to nasal cannula oxygen  Post-op Assessment: Report given to PACU RN, Post -op Vital signs reviewed and stable and Patient moving all extremities  Post vital signs: Reviewed and stable  Complications: No apparent anesthesia complications

## 2013-01-22 NOTE — Anesthesia Procedure Notes (Addendum)
Anesthesia Regional Block:  Femoral nerve block  Pre-Anesthetic Checklist: ,, timeout performed, Correct Patient, Correct Site, Correct Laterality, Correct Procedure, Correct Position, site marked, Risks and benefits discussed,  Surgical consent,  Pre-op evaluation,  At surgeon's request and post-op pain management  Laterality: Right  Prep: chloraprep       Needles:  Injection technique: Single-shot  Needle Type: Stimulator Needle - 40      Needle Gauge: 22 and 22 G    Additional Needles:  Procedures: nerve stimulator Femoral nerve block  Nerve Stimulator or Paresthesia:  Response: 0.5 mA,   Additional Responses:   Narrative:  Start time: 01/22/2013 7:02 AM End time: 01/22/2013 7:10 AM Injection made incrementally with aspirations every 5 mL. Anesthesiologist: Dr Gypsy Balsam  Additional Notes: 0702-0710 R FNB CHG prep, sterile tech #22 stim needle w/stim down to .5ma Multiple neg asp Vernia Buff .5% w/epi  Total 25cc No compl Dr Gypsy Balsam   Procedure Name: LMA Insertion Date/Time: 01/22/2013 7:24 AM Performed by: Charm Barges, Rasheema Truluck R Patient Re-evaluated:Patient Re-evaluated prior to inductionOxygen Delivery Method: Circle system utilized Preoxygenation: Pre-oxygenation with 100% oxygen Intubation Type: IV induction LMA Size: 4.0 Tube type: Oral

## 2013-01-22 NOTE — Progress Notes (Signed)
UR COMPLETED  

## 2013-01-22 NOTE — Progress Notes (Signed)
Physical Therapy Evaluation Patient Details Name: Leslie Burns MRN: [id number] DOB: 11/29/1932 Today's Date: 01/22/2013 Time: 1425-1450 PT Time Calculation (min): 25 min  PT Assessment / Plan / Recommendation Clinical Impression  Pt is 79 yo female s/p right TKA who is limited with mobility today by nausea and agitation. Based purely on eval today, would recommend SNF for rehab before going home. However, pt mentioned that she was told she had to go home. If this is truly the case, recommend HHPT. PT will follow acutely to help increase ROM and strength of the right knee and increase safe and independent mobility.     PT Assessment  Patient needs continued PT services    Follow Up Recommendations  SNF;Supervision/Assistance - 24 hour    Does the patient have the potential to tolerate intense rehabilitation      Barriers to Discharge None      Equipment Recommendations  None recommended by PT    Recommendations for Other Services OT consult   Frequency 7X/week    Precautions / Restrictions Precautions Precautions: Knee Precaution Comments: discussed no pillow under knee as well as demonstrating use of bone foam and locking out knees of bed Restrictions Weight Bearing Restrictions: Yes RLE Weight Bearing: Weight bearing as tolerated Other Position/Activity Restrictions: pt's right leg naturally rolls out into external rotation, it will be very important that she use the bone foam when not in the CPM   Pertinent Vitals/Pain Faces 4/10 right knee, repositioned, very nauseous      Mobility  Bed Mobility Bed Mobility: Rolling Left;Left Sidelying to Sit;Sitting - Scoot to Edge of Bed Rolling Left: 4: Min assist;With rail Left Sidelying to Sit: 4: Min assist;With rails Sitting - Scoot to [place] of Bed: 4: Min assist Details for Bed Mobility Assistance: vc's for sequencing and min A to support RLE Transfers Transfers: Sit to Stand;Stand to [employer] Transfers Sit to  Stand: 4: Min assist;From bed;With upper extremity assist Stand to Sit: 4: Min assist;To chair/3-in-1;With upper extremity assist Stand Pivot Transfers: 4: Min assist Details for Transfer Assistance: pt somewhat impulsive, trying to get up when instructed to wait. Able to bear some wt on the RLE without buckling. WBAT discussed. vc's for hand placement with RW Ambulation/Gait Ambulation/Gait Assistance: Not tested (comment) (nausea) Assistive device: Rolling walker Stairs: No Wheelchair Mobility Wheelchair Mobility: No    Exercises Total Joint Exercises Ankle Circles/Pumps: AROM;Both;10 reps;Seated   PT Diagnosis: Difficulty walking;Acute pain  PT Problem List: Decreased strength;Decreased range of motion;Decreased activity tolerance;Decreased mobility;Decreased cognition;Decreased knowledge of use of DME;Decreased knowledge of precautions;Pain PT Treatment Interventions: DME instruction;Gait training;Functional mobility training;Therapeutic activities;Therapeutic exercise;Balance training;Patient/family education   PT Goals Acute Rehab PT Goals PT Goal Formulation: With patient Time For Goal Achievement: 01/29/13 Potential to Achieve Goals: Good Pt will go Supine/Side to Sit: with modified independence PT Goal: Supine/Side to Sit - Progress: Goal set today Pt will go Sit to Supine/Side: with modified independence PT Goal: Sit to Supine/Side - Progress: Goal set today Pt will go Sit to Stand: with modified independence PT Goal: Sit to Stand - Progress: Goal set today Pt will go Stand to Sit: with modified independence PT Goal: Stand to Sit - Progress: Goal set today Pt will Ambulate: >150 feet;with modified independence;with rolling walker PT Goal: Ambulate - Progress: Goal set today Pt will Perform Home Exercise Program: with supervision, verbal cues required/provided PT Goal: Perform Home Exercise Program - Progress: Goal set today  Visit Information  Last PT Received On:  01/22/13 Assistance Needed: +1    Subjective Data  Subjective: pt very nauseous and cold and wants a warm blanket and hot tea and [place] upset that she was given narcotics Patient Stated Goal: she said she was told she would have to return straight home?   Prior Functioning  Home Living Lives With: Spouse Available Help at Discharge: Available 24 hours/day;Family Type of Home: House Home Access: Level entry Home Layout: One level Bathroom Toilet: Handicapped height Bathroom Accessibility: Yes How Accessible: Accessible via walker Home Adaptive Equipment: Walker - rolling Additional Comments: pt says that she does not have any steps though she is quite unhappy this afternoon and is not giving very thorough answers so question some of PLOF info Prior Function Level of Independence: Independent Vocation: Retired Comments: pt reports that her daughter have come into town to help her as well as her husband and son Communication Communication: No difficulties    Copywriter, advertising Overall Cognitive Status: Impaired Arousal/Alertness: Awake/alert Orientation Level: Appears intact for tasks assessed Behavior During Session: Agitated Cognition - Other Comments: very agitated this afternoon    Extremity/Trunk Assessment Right Upper Extremity Assessment RUE ROM/Strength/Tone: WFL for tasks assessed Left Upper Extremity Assessment LUE ROM/Strength/Tone: WFL for tasks assessed Right Lower Extremity Assessment RLE ROM/Strength/Tone: Deficits RLE ROM/Strength/Tone Deficits: quad 1/5, hip flex 2/5 RLE Sensation: WFL - Light Touch Left Lower Extremity Assessment LLE ROM/Strength/Tone: WFL for tasks assessed LLE Sensation: WFL - Light Touch;WFL - Proprioception LLE Coordination: WFL - gross motor Trunk Assessment Trunk Assessment: Normal   Balance Balance Balance Assessed: No  End of Session PT - End of Session Equipment Utilized During Treatment: Gait belt Activity Tolerance: Other  (comment) (limited by nausea and agitation) Patient left: in chair;with call bell/phone within reach Nurse Communication: Mobility status CPM Right Knee CPM Right Knee: Off Additional Comments: pt very nauseous throughout, RN made aware  GP    Lyanne Co, PT  Acute Rehab Services  [phone]  Lyanne Co 01/22/2013, 3:16 PM

## 2013-01-22 NOTE — Plan of Care (Signed)
Problem: Consults Goal: Diagnosis- Total Joint Replacement Primary Total Knee Right     

## 2013-01-22 NOTE — H&P (Signed)
Leslie Burns MRN:  [id number] DOB/SEX:  1932-11-30/female  CHIEF COMPLAINT:  Painful right Knee  HISTORY: Patient is a 79 y.o. female presented with a history of pain in the right knee. Onset of symptoms was gradual starting several years ago with gradually worsening course since that time. Prior procedures on the knee include meniscectomy. Patient has been treated conservatively with over-the-counter NSAIDs and activity modification. Patient currently rates pain in the knee at 10 out of 10 with activity. There is no pain at night.  PAST MEDICAL HISTORY: Patient Active Problem List   Diagnosis Date Noted  . PVC   and PAC s 12/06/2012  . Preop cardiovascular exam 12/06/2012  . SVT (supraventricular tachycardia) 11/24/2012  . Hypertension 11/24/2012  . Edema 11/24/2012  . Osteoarthritis of right knee 11/24/2012  . Vertigo 11/24/2012   Past Medical History  Diagnosis Date  . Arthritis   . Chicken pox   . Seasonal allergies   . Hypertension     high readings  . High cholesterol   . Ovarian cyst 2007  . Vertigo   . SVT (supraventricular tachycardia)   . Neuromuscular disorder     numbness&tingling arms &hands  . PONV (postoperative nausea and vomiting)     everytime, always sick with anesth.    Past Surgical History  Procedure Laterality Date  . Tonsillectomy and adenoidectomy  1945  . Combined hysteroscopy diagnostic / d&c  1958  . Combined hysteroscopy diagnostic / d&c  1972    tubal pregnancy  . Av nodel ablation  1995  . Double cataract surgery  2012  . Cyst excision      ovarian     MEDICATIONS:   Prescriptions prior to admission  Medication Sig Dispense Refill  . Ascorbic Acid (VITAMIN C PO) Take 1 tablet by mouth daily.      Marland Kitchen atenolol (TENORMIN) 25 MG tablet Take 25 mg by mouth daily.      . calcium citrate (CALCITRATE - DOSED IN MG ELEMENTAL CALCIUM) 950 MG tablet Take 1 tablet by mouth daily.      . Cholecalciferol (VITAMIN D3) 5000 UNITS TABS Take 1  tablet by mouth daily.      . Coenzyme Q10 (CO Q 10) 100 MG CAPS Take 1 capsule by mouth daily.      . Glucosamine-Chondroitin-MSM (TRIPLE FLEX PO) Take 2 capsules by mouth daily.      . meclizine (ANTIVERT) 25 MG tablet Take 12.5 mg by mouth 3 (three) times daily as needed for dizziness.      . Nutritional Supplements (JUICE PLUS FIBRE PO) Take 2 tablets by mouth daily.        ALLERGIES:   Allergies  Allergen Reactions  . Codeine Other (See Comments)    hallucinations  . Erythromycin Nausea And Vomiting  . Nitroglycerin     Patch    REVIEW OF SYSTEMS:  Pertinent items are noted in HPI.   FAMILY HISTORY:   Family History  Problem Relation Age of Onset  . Stroke Mother 44  . Heart disease Father 77    MI  . Alcohol abuse      parent  . Alcohol abuse      grandparent  . Arthritis      parent  . Hyperlipidemia      parent  . Hypertension      parent    SOCIAL HISTORY:   History  Substance Use Topics  . Smoking status: Never Smoker   . Smokeless tobacco: Not  on file  . Alcohol Use: No     EXAMINATION:  Vital signs in last 24 hours: Temp:  [97.7 F (36.5 C)] 97.7 F (36.5 C) (03/31 0549) Pulse Rate:  [60] 60 (03/31 0549) Resp:  [20] 20 (03/31 0549) BP: (169)/(79) 169/79 mmHg (03/31 0549) SpO2:  [99 %] 99 % (03/31 0549)  General appearance: alert, cooperative and no distress Lungs: clear to auscultation bilaterally Heart: regular rate and rhythm, S1, S2 normal, no murmur, click, rub or gallop Abdomen: soft, non-tender; bowel sounds normal; no masses,  no organomegaly Extremities: extremities normal, atraumatic, no cyanosis or edema and Homans sign is negative, no sign of DVT Pulses: 2+ and symmetric Skin: Skin color, texture, turgor normal. No rashes or lesions Neurologic: Alert and oriented X 3, normal strength and tone. Normal symmetric reflexes. Normal coordination and gait  Musculoskeletal:  ROM 0-100, Ligaments intact,  Imaging Review Plain  radiographs demonstrate severe degenerative joint disease of the right knee. The overall alignment is mild valgus. The bone quality appears to be good for age and reported activity level.  Assessment/Plan: End stage arthritis, right knee   The patient history, physical examination and imaging studies are consistent with advanced degenerative joint disease of the right knee. The patient has failed conservative treatment.  The clearance notes were reviewed.  After discussion with the patient it was felt that Total Knee Replacement was indicated. The procedure,  risks, and benefits of total knee arthroplasty were presented and reviewed. The risks including but not limited to aseptic loosening, infection, blood clots, vascular injury, stiffness, patella tracking problems complications among others were discussed. The patient acknowledged the explanation, agreed to proceed with the plan.  Leslie Burns 01/22/2013, 6:37 AM

## 2013-01-22 NOTE — Anesthesia Preprocedure Evaluation (Addendum)
Anesthesia Evaluation  Patient identified by MRN, date of birth, ID band Patient awake    Reviewed: Allergy & Precautions, H&P , NPO status , Patient's Chart, lab work & pertinent test results  History of Anesthesia Complications (+) PONV  Airway Mallampati: II      Dental  (+) Teeth Intact and Dental Advisory Given   Pulmonary  breath sounds clear to auscultation        Cardiovascular hypertension, Pt. on medications and Pt. on home beta blockers + dysrhythmias Rhythm:Regular Rate:Normal  S/p ablation for svt   Neuro/Psych    GI/Hepatic   Endo/Other    Renal/GU      Musculoskeletal   Abdominal Normal abdominal exam  (+)   Peds  Hematology   Anesthesia Other Findings   Reproductive/Obstetrics                          Anesthesia Physical Anesthesia Plan  ASA: II  Anesthesia Plan: General   Post-op Pain Management:    Induction: Intravenous  Airway Management Planned: LMA  Additional Equipment:   Intra-op Plan:   Post-operative Plan: Extubation in OR  Informed Consent: I have reviewed the patients History and Physical, chart, labs and discussed the procedure including the risks, benefits and alternatives for the proposed anesthesia with the patient or authorized representative who has indicated his/her understanding and acceptance.   Dental advisory given  Plan Discussed with: CRNA and Surgeon  Anesthesia Plan Comments:        Anesthesia Quick Evaluation

## 2013-01-23 ENCOUNTER — Encounter (HOSPITAL_COMMUNITY): Payer: Self-pay | Admitting: Orthopedic Surgery

## 2013-01-23 LAB — BASIC METABOLIC PANEL
BUN: 12 mg/dL (ref 6–23)
CO2: 23 mEq/L (ref 19–32)
Chloride: 106 mEq/L (ref 96–112)
GFR calc non Af Amer: 83 mL/min — ABNORMAL LOW (ref >90)
Glucose, Bld: 164 mg/dL — ABNORMAL HIGH (ref 70–99)
Potassium: 4 mEq/L (ref 3.5–5.1)
Sodium: 139 mEq/L (ref 135–145)

## 2013-01-23 LAB — CBC
HCT: 31.8 % — ABNORMAL LOW (ref 36.0–46.0)
Hemoglobin: 10.8 g/dL — ABNORMAL LOW (ref 12.0–15.0)
MCHC: 34 g/dL (ref 30.0–36.0)
RBC: 3.8 MIL/uL — ABNORMAL LOW (ref 3.87–5.11)
WBC: 14.3 10*3/uL — ABNORMAL HIGH (ref 4.0–10.5)

## 2013-01-23 MED ORDER — ENOXAPARIN SODIUM 40 MG/0.4ML ~~LOC~~ SOLN: 40.0000 mg | SUBCUTANEOUS | Status: DC

## 2013-01-23 MED ORDER — METHOCARBAMOL 500 MG PO TABS: 500.0000 mg | ORAL_TABLET | Freq: Four times a day (QID) | ORAL | Status: DC | PRN

## 2013-01-23 MED ORDER — OXYCODONE HCL 5 MG PO TABS: 5.0000 mg | ORAL_TABLET | ORAL | Status: DC | PRN

## 2013-01-23 MED ORDER — CELECOXIB 200 MG PO CAPS: 200.0000 mg | ORAL_CAPSULE | Freq: Two times a day (BID) | ORAL | Status: DC

## 2013-01-23 NOTE — Progress Notes (Signed)
Physical Therapy Treatment Patient Details Name: Leslie Burns MRN: [id number] DOB: 08-09-1933 Today's Date: 01/23/2013 Time: 3086-5784 PT Time Calculation (min): 40 min  PT Assessment / Plan / Recommendation Comments on Treatment Session  Pt demonstrating safety and Modified Independence/ Supervision with mobility and is safe for d/c home when cleared by MD.      Follow Up Recommendations  Home health PT     Does the patient have the potential to tolerate intense rehabilitation     Barriers to Discharge        Equipment Recommendations  None recommended by PT    Recommendations for Other Services OT consult  Frequency 7X/week   Plan Frequency remains appropriate;Discharge plan needs to be updated    Precautions / Restrictions Precautions Precautions: Knee Restrictions Weight Bearing Restrictions: Yes RLE Weight Bearing: Weight bearing as tolerated   Pertinent Vitals/Pain 5/10 pain in knee.      Mobility  Bed Mobility Bed Mobility: Sit to Supine Rolling Left: Not tested (comment) Left Sidelying to Sit: Not tested (comment) Supine to Sit: Not tested (comment) Sitting - Scoot to Edge of Bed: Not tested (comment) Sit to Supine: 5: Supervision Details for Bed Mobility Assistance: no assistance or cues required Transfers Transfers: Sit to Stand;Stand to Sit Sit to Stand: 5: Supervision;From chair/3-in-1 Stand to Sit: 5: Supervision;With upper extremity assist;To chair/3-in-1;To toilet Details for Transfer Assistance: Vcs for safe technique.  Ambulation/Gait Ambulation/Gait Assistance: 5: Supervision Ambulation Distance (Feet): 150 Feet Assistive device: Rolling walker Ambulation/Gait Assistance Details: pt demonstrating safe technique Gait Pattern: Step-through pattern Gait velocity: WFL Stairs: No    Exercises Total Joint Exercises Ankle Circles/Pumps: AROM;Both;10 reps;Seated Quad Sets: Right;10 reps Short Arc Quad: Right;10 reps Heel Slides: Right;10  reps Straight Leg Raises: Right;10 reps;AROM   PT Diagnosis:    PT Problem List:   PT Treatment Interventions:     PT Goals Acute Rehab PT Goals PT Goal Formulation: With patient Time For Goal Achievement: 01/29/13 Potential to Achieve Goals: Good Pt will go Supine/Side to Sit: with modified independence PT Goal: Supine/Side to Sit - Progress: Progressing toward goal Pt will go Sit to Supine/Side: with modified independence PT Goal: Sit to Supine/Side - Progress: Progressing toward goal Pt will go Sit to Stand: with modified independence PT Goal: Sit to Stand - Progress: Progressing toward goal Pt will go Stand to Sit: with modified independence PT Goal: Stand to Sit - Progress: Progressing toward goal Pt will Ambulate: >150 feet;with modified independence;with rolling walker PT Goal: Ambulate - Progress: Met Pt will Perform Home Exercise Program: with supervision, verbal cues required/provided PT Goal: Perform Home Exercise Program - Progress: Met  Visit Information  Last PT Received On: 01/23/13 Assistance Needed: +1    Subjective Data      Cognition  Cognition Overall Cognitive Status: Appears within functional limits for tasks assessed/performed Arousal/Alertness: Awake/alert Orientation Level: Appears intact for tasks assessed Behavior During Session: [facility] for tasks performed    Balance     End of Session PT - End of Session Equipment Utilized During Treatment: Gait belt Activity Tolerance: Patient tolerated treatment well Patient left: in bed;with call bell/phone within reach;in CPM Nurse Communication: Mobility status   GP     Jakeya Gherardi 01/23/2013, 3:56 PM Annelise Mccoy L. Hadyn Blanck DPT [phone]

## 2013-01-23 NOTE — Progress Notes (Signed)
Physical Therapy Treatment Patient Details Name: Leslie Burns MRN: [id number] DOB: 1933-09-03 Today's Date: 01/23/2013 Time: 4098-1191 PT Time Calculation (min): 23 min  PT Assessment / Plan / Recommendation Comments on Treatment Session  During session pt donned underwear independently, crossing RLE over LLE.  Pt doing very well with functional mobility and should be safe to d/c home today if cleared by MD.      Follow Up Recommendations  Home health PT     Does the patient have the potential to tolerate intense rehabilitation     Barriers to Discharge        Equipment Recommendations  None recommended by PT    Recommendations for Other Services OT consult  Frequency 7X/week   Plan Frequency remains appropriate;Discharge plan needs to be updated    Precautions / Restrictions Precautions Precautions: Knee Precaution Comments: discussed no pillow under knee as well as demonstrating use of bone foam and locking out knees of bed Restrictions Weight Bearing Restrictions: Yes RLE Weight Bearing: Weight bearing as tolerated   Pertinent Vitals/Pain 3/10 pain in knee. Premedicated.     Mobility  Bed Mobility Bed Mobility: Supine to Sit Rolling Left: Not tested (comment) Left Sidelying to Sit: Not tested (comment) Supine to Sit: 5: Supervision Sitting - Scoot to Edge of Bed: 5: Supervision Details for Bed Mobility Assistance: no assistance required.   Transfers Transfers: Sit to Stand;Stand to Sit Sit to Stand: 5: Supervision;From bed;With upper extremity assist Stand to Sit: 5: Supervision;To chair/3-in-1;With upper extremity assist Stand Pivot Transfers: Not tested (comment) Details for Transfer Assistance: Vcs for technique.   No assistance required.  Ambulation/Gait Ambulation/Gait Assistance: 5: Supervision Ambulation Distance (Feet): 150 Feet Assistive device: Rolling walker Ambulation/Gait Assistance Details: VCs for gait sequencing and proprer distancing from  walker.   Gait Pattern: Step-through pattern Gait velocity: WFL Stairs: No Wheelchair Mobility Wheelchair Mobility: No    Exercises Total Joint Exercises Ankle Circles/Pumps: AROM;Both;10 reps;Seated   PT Diagnosis:    PT Problem List:   PT Treatment Interventions:     PT Goals Acute Rehab PT Goals PT Goal Formulation: With patient Time For Goal Achievement: 01/29/13 Potential to Achieve Goals: Good Pt will go Supine/Side to Sit: with modified independence PT Goal: Supine/Side to Sit - Progress: Progressing toward goal Pt will go Sit to Supine/Side: with modified independence PT Goal: Sit to Supine/Side - Progress: Progressing toward goal Pt will go Sit to Stand: with modified independence PT Goal: Sit to Stand - Progress: Progressing toward goal Pt will go Stand to Sit: with modified independence PT Goal: Stand to Sit - Progress: Progressing toward goal Pt will Ambulate: >150 feet;with modified independence;with rolling walker PT Goal: Ambulate - Progress: Progressing toward goal Pt will Perform Home Exercise Program: with supervision, verbal cues required/provided PT Goal: Perform Home Exercise Program - Progress: Progressing toward goal  Visit Information  Last PT Received On: 01/23/13 Assistance Needed: +1    Subjective Data  Subjective: I feel better than last night.     Cognition  Cognition Overall Cognitive Status: Appears within functional limits for tasks assessed/performed Arousal/Alertness: Awake/alert Orientation Level: Appears intact for tasks assessed Behavior During Session: [facility] for tasks performed    Balance  Balance Balance Assessed: No  End of Session PT - End of Session Equipment Utilized During Treatment: Gait belt Activity Tolerance: Patient tolerated treatment well Patient left: in chair;with call bell/phone within reach Nurse Communication: Mobility status CPM Right Knee CPM Right Knee: Off   GP  Kathlynn Swofford 01/23/2013, 11:38  AM Theron Arista L. Wanya Bangura DPT [phone]

## 2013-01-23 NOTE — Progress Notes (Signed)
SPORTS MEDICINE AND JOINT REPLACEMENT  Leslie Spurling, MD   Leslie Cabal, PA-C [address] [place], [place], [place]  [address]                             [phone]   PROGRESS NOTE  Subjective:  negative for Chest Pain  negative for Shortness of Breath  positive for Nausea/Vomiting   negative for Calf Pain  negative for Bowel Movement   Tolerating Diet: yes         Patient reports pain as 5 on 0-10 scale.    Objective: Vital signs in last 24 hours:   Patient Vitals for the past 24 hrs:  BP Temp Temp src Pulse Resp SpO2  01/23/13 0605 157/61 mmHg 98.8 F (37.1 C) Oral 64 18 99 %  01/22/13 2129 160/67 mmHg 98.4 F (36.9 C) Oral 68 18 98 %  01/22/13 1600 - - - - 18 -  01/22/13 1200 - - - - 18 -  01/22/13 1115 160/60 mmHg 97.5 F (36.4 C) - 54 15 98 %  01/22/13 1100 150/71 mmHg - - 60 15 95 %  01/22/13 1045 159/60 mmHg - - 63 14 100 %  01/22/13 1030 162/67 mmHg - - 66 12 100 %  01/22/13 1015 151/63 mmHg - - 64 13 97 %  01/22/13 1000 156/61 mmHg - - 66 13 95 %  01/22/13 0952 - - - 67 11 100 %  01/22/13 0945 165/64 mmHg - - 70 19 97 %  01/22/13 0930 177/70 mmHg - - 70 16 99 %  01/22/13 0915 174/65 mmHg - - 68 9 97 %  01/22/13 0911 - 97.5 F (36.4 C) - - - -    @flow {1959:LAST@   Intake/Output from previous day:   03/31 0701 - 04/01 0700 In: 1355 [I.V.:1300] Out: 4450 [Urine:3500; Drains:900]   Intake/Output this shift:       Intake/Output     03/31 0701 - 04/01 0700 04/01 0701 - 04/02 0700   I.V. 1300    IV Piggyback 55    Total Intake 1355     Urine 3500    Drains 900    Blood 50    Total Output 4450     Net -3095             LABORATORY DATA:  Recent Labs  01/22/13 1315 01/23/13 0606  WBC 15.2* 14.3*  HGB 13.2 10.8*  HCT 39.1 31.8*  PLT 222 219    Recent Labs  01/22/13 1315  CREATININE 0.62   Lab Results  Component Value Date   INR 1.05 01/15/2013    Examination:  General appearance: alert, cooperative and no  distress Extremities: Homans sign is negative, no sign of DVT  Wound Exam: clean, dry, intact   Drainage:  Scant/small amount Serosanguinous exudate  Motor Exam: EHL and FHL Intact  Sensory Exam: Deep Peroneal normal   Assessment:    1 Day Post-Op  Procedure(s) (LRB): TOTAL KNEE ARTHROPLASTY (Right)  ADDITIONAL DIAGNOSIS:  Active Problems:   * No active hospital problems. *  Acute Blood Loss Anemia   Plan: Physical Therapy as ordered Weight Bearing as Tolerated (WBAT)  DVT Prophylaxis:  Lovenox  DISCHARGE PLAN: Home  DISCHARGE NEEDS: HHPT, CPM, Walker and 3-in-1 comode seat         Leslie Burns 01/23/2013, 7:31 AM

## 2013-01-23 NOTE — Progress Notes (Addendum)
Occupational Therapy Evaluation Patient Details Name: Leslie Burns MRN: [id number] DOB: 10-Dec-1932 Today's Date: 01/23/2013 Time: 9562-1308 OT Time Calculation (min): 20 min  OT Assessment / Plan / Recommendation Clinical Impression    Pt is 79 yo female s/p right TKA. Pt at [employer] level for ADLs and feel pt is safe to return home as she has 24 hour assistance available. No further OT needs.      OT Assessment  Patient does not need any further OT services    Follow Up Recommendations  No OT follow up    Barriers to Discharge  None    Equipment Recommendations  3 in 1 bedside comode    Precautions / Restrictions Precautions Precautions: Knee Restrictions Weight Bearing Restrictions: Yes RLE Weight Bearing: Weight bearing as tolerated   Pertinent Vitals/Pain Pain 5/10 in knee. Repositioned and Distracted     ADL  Eating/Feeding: Simulated;Independent Where Assessed - Eating/Feeding: Edge of bed Grooming: Simulated;Supervision/safety Where Assessed - Grooming: Supported standing Upper Body Bathing: Simulated;Supervision/safety Where Assessed - Upper Body Bathing: Unsupported sitting Lower Body Bathing: Supervision/safety Where Assessed - Lower Body Bathing: Supported sitting Upper Body Dressing: Simulated;Supervision/safety Where Assessed - Upper Body Dressing: Unsupported sitting Lower Body Dressing: Performed;Supervision/safety Where Assessed - Lower Body Dressing: Supported sitting Toilet Transfer: Research scientist (life sciences) Method: Sit to Barista: Raised toilet seat with arms (or 3-in-1 over toilet) Toileting - Clothing Manipulation and Hygiene: Performed;Supervision/safety Where Assessed - Engineer, mining and Hygiene: Standing Tub/Shower Transfer: Simulated;Min guard Tub/Shower Transfer Method: Science writer: Walk in Scientist, research (physical sciences) Used: Rolling  walker Transfers/Ambulation Related to ADLs: Cues required to slow down during transfers and ambulation ADL Comments: Pt at [employer] level for ADLs.          Visit Information  Last OT Received On: 01/23/13 Assistance Needed: +1    Subjective Data   I can put my socks on.   Prior Functioning     Home Living Lives With: Spouse Available Help at Discharge: Available 24 hours/day;Family Type of Home: House Home Access: Level entry Home Layout: One level Bathroom Shower/Tub: Walk-in shower;Door [employer] Toilet: Handicapped height Bathroom Accessibility: Yes How Accessible: Accessible via walker Home Adaptive Equipment: Shower chair without back;Hand-held shower hose Prior Function Level of Independence: Independent Vocation: Retired Musician: No difficulties           Copywriter, advertising Overall Cognitive Status: Appears within functional limits for tasks assessed/performed Arousal/Alertness: Awake/alert Orientation Level: Appears intact for tasks assessed Behavior During Session: [facility] for tasks performed    Extremity/Trunk Assessment Right Upper Extremity Assessment RUE ROM/Strength/Tone: [facility] for tasks assessed (pt stated that she has arthritis in Rt shoulder) Left Upper Extremity Assessment LUE ROM/Strength/Tone: WFL for tasks assessed Trunk Assessment Trunk Assessment: Normal     Mobility Bed Mobility Bed Mobility: Supine to Sit;Sitting - Scoot to Edge of Bed Supine to Sit: 5: Supervision Sitting - Scoot to Edge of Bed: 5: Supervision Details for Bed Mobility Assistance: no assistance or cues required Transfers Transfers: Sit to Stand;Stand to Sit Sit to Stand: 5: Supervision;With upper extremity assist;From bed;From toilet Stand to Sit: 5: Supervision;With upper extremity assist;To chair/3-in-1;To toilet Details for Transfer Assistance: vc's required to slow down during transfers and ambulation.            End of  Session OT - End of Session Activity Tolerance: Patient tolerated treatment well Patient left: in bed;with call bell/phone within reach;with family/visitor present  Earlie Raveling OTR/L [phone] 01/23/2013, 2:46 PM

## 2013-01-23 NOTE — Op Note (Signed)
TOTAL KNEE REPLACEMENT OPERATIVE NOTE:  01/22/2013  3:58 PM  PATIENT:  Leslie Burns  79 y.o. female  PRE-OPERATIVE DIAGNOSIS:  ostoearthritis right knee  POST-OPERATIVE DIAGNOSIS:  ostoearthritis right knee  PROCEDURE:  Procedure(s): TOTAL KNEE ARTHROPLASTY  SURGEON:  Surgeon(s): Dannielle Huh, MD  PHYSICIAN ASSISTANT: [name], [facility]  ANESTHESIA:   general  DRAINS: Hemovac and On-Q Marcaine Pain Pump  SPECIMEN: None  COUNTS:  Correct  TOURNIQUET:   Total Tourniquet Time Documented: Thigh (Right) - 52 minutes Total: Thigh (Right) - 52 minutes   DICTATION:  Indication for procedure:    The patient is a 79 y.o. female who has failed conservative treatment for ostoearthritis right knee.  Informed consent was obtained prior to anesthesia. The risks versus benefits of the operation were explain and in a way the patient can, and did, understand.   Description of procedure:     The patient was taken to the operating room and placed under anesthesia.  The patient was positioned in the usual fashion taking care that all body parts were adequately padded and/or protected.  I foley catheter was placed.  A tourniquet was applied and the leg prepped and draped in the usual sterile fashion.  The extremity was exsanguinated with the esmarch and tourniquet inflated to 350 mmHg.  Pre-operative range of motion was normal.  The knee was in 3 degree of mild valgus.  A midline incision approximately 6-7 inches long was made with a #10 blade.  A new blade was used to make a parapatellar arthrotomy going 2-3 cm into the quadriceps tendon, over the patella, and alongside the medial aspect of the patellar tendon.  A synovectomy was then performed with the #10 blade and forceps. I then elevated the deep MCL off the medial tibial metaphysis subperiosteally around to the semimembranosus attachment.    I everted the patella and used calipers to measure patellar thickness.  I used the reamer to  ream down to appropriate thickness to recreate the native thickness.  I then removed excess bone with the rongeur and sagittal saw.  I used the appropriately sized template and drilled the three lug holes.  I then put the trial in place and measured the thickness with the calipers to ensure recreation of the native thickness.  The trial was then removed and the patella subluxed and the knee brought into flexion.  A homan retractor was place to retract and protect the patella and lateral structures.  A Z-retractor was place medially to protect the medial structures.  The extra-medullary alignment system was used to make cut the tibial articular surface perpendicular to the anamotic axis of the tibia and in 3 degrees of posterior slope.  The cut surface and alignment jig was removed.  I then used the intramedullary alignment guide to make a 4 valgus cut on the distal femur.  I then marked out the epicondylar axis on the distal femur.  The posterior condylar axis measured 5 degrees.  I then used the anterior referencing sizer and measured the femur to be a size D.  The 4-In-1 cutting block was screwed into place in external rotation matching the posterior condylar angle, making our cuts perpendicular to the epicondylar axis.  Anterior, posterior and chamfer cuts were made with the sagittal saw.  The cutting block and cut pieces were removed.  A lamina spreader was placed in 90 degrees of flexion.  The ACL, PCL, menisci, and posterior condylar osteophytes were removed.  A 10 mm spacer blocked was  found to offer good flexion and extension gap balance after moderate in degree releasing.   The scoop retractor was then placed and the femoral finishing block was pinned in place.  The small sagittal saw was used as well as the lug drill to finish the femur.  The block and cut surfaces were removed and the medullary canal hole filled with autograft bone from the cut pieces.  The tibia was delivered forward in deep  flexion and external rotation.  A size 4 tray was selected and pinned into place centered on the medial 1/3 of the tibial tubercle.  The reamer and keel was used to prepare the tibia through the tray.    I then trialed with the size D femur, size 4 tibia, a 10 mm insert and the 30 patella.  I had excellent flexion/extension gap balance, excellent patella tracking.  Flexion was full and beyond 120 degrees; extension was zero.  These components were chosen and the staff opened them to me on the back table while the knee was lavaged copiously and the cement mixed.  I cemented in the components and removed all excess cement.  The polyethylene tibial component was snapped into place and the knee placed in extension while cement was hardening.  The capsule was infilltrated with 20cc of .25% Marcaine with epinephrine.  A hemovac was place in the joint exiting superolaterally.  A pain pump was place superomedially superficial to the arthrotomy.  Once the cement was hard, the tourniquet was let down.  Hemostasis was obtained.  The arthrotomy was closed with figure-8 #1 vicryl sutures.  The deep soft tissues were closed with #0 vicryls and the subcuticular layer closed with a running #2-0 vicryl.  The skin was reapproximated and closed with skin staples.  The wound was dressed with xeroform, 4 x4's, 2 ABD sponges, a single layer of webril and a TED stocking.   The patient was then awakened, extubated, and taken to the recovery room in stable condition.  BLOOD LOSS:  300cc DRAINS: 1 hemovac, 1 pain catheter COMPLICATIONS:  None.  PLAN OF CARE: Admit to inpatient   PATIENT DISPOSITION:  PACU - hemodynamically stable.   Delay start of Pharmacological VTE agent (>24hrs) due to surgical blood loss or risk of bleeding:  not applicable  Please fax a copy of this op note to my office at [phone] (please only include page 1 and 2 of the Case Information op note)

## 2013-01-23 NOTE — Progress Notes (Signed)
PT is recommending home with HH and not SNF. CSW will make CM aware. Clinical Social Worker will sign off for now as social work intervention is no longer needed. Please consult us again if new need arises.   Corbitt Cloke, MSW [phone] 

## 2013-01-23 NOTE — Discharge Summary (Signed)
SPORTS MEDICINE & JOINT REPLACEMENT   Leslie Spurling, MD   Leslie Cabal, PA-C [address] [place], [place], [place]  [address]                             [phone]  PATIENT ID: Leslie Burns        MRN:  [id number]          DOB/AGE: 11/12/1932 / 79 y.o.    DISCHARGE SUMMARY  ADMISSION DATE:    01/22/2013 DISCHARGE DATE:   01/23/2013   ADMISSION DIAGNOSIS: ostoearthritis right knee    DISCHARGE DIAGNOSIS:  ostoearthritis right knee    ADDITIONAL DIAGNOSIS: Active Problems:   * No active hospital problems. *  Past Medical History  Diagnosis Date  . Chicken pox   . Seasonal allergies   . Hypertension     high readings  . High cholesterol   . Ovarian cyst 2007  . Vertigo   . SVT (supraventricular tachycardia)   . Neuromuscular disorder     numbness&tingling arms &hands  . PONV (postoperative nausea and vomiting)     everytime, always sick with anesth.   . Arthritis     "both knees" (01/22/2013)  . Basal cell carcinoma of back ` 1980's    PROCEDURE: Procedure(s): TOTAL KNEE ARTHROPLASTY on 01/22/2013  CONSULTS:     HISTORY:  See H&P in chart  HOSPITAL COURSE:  Leslie Burns is a 79 y.o. admitted on 01/22/2013 and found to have a diagnosis of ostoearthritis right knee.  After appropriate laboratory studies were obtained  they were taken to the operating room on 01/22/2013 and underwent Procedure(s): TOTAL KNEE ARTHROPLASTY.   They were given perioperative antibiotics:  Anti-infectives   Start     Dose/Rate Route Frequency Ordered Stop   01/22/13 1330  ceFAZolin (ANCEF) IVPB 1 g/50 mL premix     1 g 100 mL/hr over 30 Minutes Intravenous Every 6 hours 01/22/13 1142 01/22/13 2035   01/22/13 0600  ceFAZolin (ANCEF) IVPB 2 g/50 mL premix     2 g 100 mL/hr over 30 Minutes Intravenous On call to O.R. 01/21/13 1545 01/22/13 0727    .  Tolerated the procedure well.  Placed with a foley intraoperatively.  Given Ofirmev at induction and for 48 hours.    POD# 1:  Vital signs were stable.  Patient denied Chest pain, shortness of breath, or calf pain.  Patient was started on Lovenox 30 mg subcutaneously twice daily at 8am.  Consults to PT, OT, and care management were made.  The patient was weight bearing as tolerated.  CPM was placed on the operative leg 0-90 degrees for 6-8 hours a day.  Incentive spirometry was taught.  Dressing was changed.  Marcaine pump and hemovac were discontinued.  Continued  PT for ambulation and exercise program.  IV saline locked.  O2 discontinued.    The remainder of the hospital course was dedicated to ambulation and strengthening.   The patient was discharged on 1 Day Post-Op in  Good condition.  Blood products given:none  DIAGNOSTIC STUDIES: Recent vital signs: Patient Vitals for the past 24 hrs:  BP Temp Temp src Pulse Resp SpO2  01/23/13 1400 120/40 mmHg 97.9 F (36.6 C) Oral 61 18 98 %  01/23/13 1041 151/60 mmHg - - - - -  01/23/13 0605 157/61 mmHg 98.8 F (37.1 C) Oral 64 18 99 %  01/22/13 2129 160/67 mmHg 98.4 F (36.9 C) Oral  68 18 98 %       Recent laboratory studies:  Recent Labs  01/22/13 1315 01/23/13 0606  WBC 15.2* 14.3*  HGB 13.2 10.8*  HCT 39.1 31.8*  PLT 222 219    Recent Labs  01/22/13 1315 01/23/13 0606  NA  --  139  K  --  4.0  CL  --  106  CO2  --  23  BUN  --  12  CREATININE 0.62 0.65  GLUCOSE  --  164*  CALCIUM  --  8.7   Lab Results  Component Value Date   INR 1.05 01/15/2013     Recent Radiographic Studies :  Dg Chest 2 View  01/15/2013  *RADIOLOGY REPORT*  Clinical Data: Preoperative evaluation for right knee surgery.  CHEST - 2 VIEW  Comparison: No priors.  Findings: Lung volumes are normal.  No consolidative airspace disease.  No pleural effusions.  No pneumothorax. There is several tiny dense nodular opacities projecting over the lungs bilaterally, favored to represent a small calcified granulomas.  No suspicious pulmonary nodule or mass noted.  Pulmonary vasculature  and the cardiomediastinal silhouette are within normal limits. Atherosclerosis in the thoracic aorta.  Mild bilateral apical pleuroparenchymal thickening (right greater than left), likely represents scarring.  IMPRESSION: 1.  No radiographic evidence of acute cardiopulmonary disease. 2.  Atherosclerosis.   Original Report Authenticated By: Trudie Reed, M.D.     DISCHARGE INSTRUCTIONS: Discharge Orders   Future Appointments Provider Department Dept Phone   02/19/2013 10:15 AM Terressa Koyanagi, DO [facility] [phone]   Future Orders Complete By Expires     CPM  As directed     Comments:      Continuous passive motion machine (CPM):      Use the CPM from 0 to 90 for 6-8 hours per day.      You may increase by 10 per day.  You may break it up into 2 or 3 sessions per day.      Use CPM for 2 weeks or until you are told to stop.    Call MD / Call 911  As directed     Comments:      If you experience chest pain or shortness of breath, CALL 911 and be transported to the hospital emergency room.  If you develope a fever above 101 F, pus (white drainage) or increased drainage or redness at the wound, or calf pain, call your surgeon's office.    Change dressing  As directed     Comments:      Change dressing on wednesday, then change the dressing daily with sterile 4 x 4 inch gauze dressing and apply TED hose.    Constipation Prevention  As directed     Comments:      Drink plenty of fluids.  Prune juice may be helpful.  You may use a stool softener, such as Colace (over the counter) 100 mg twice a day.  Use MiraLax (over the counter) for constipation as needed.    Diet - low sodium heart healthy  As directed     Do not put a pillow under the knee. Place it under the heel.  As directed     Driving restrictions  As directed     Comments:      No driving for 6 weeks    Increase activity slowly as tolerated  As directed     Lifting restrictions  As directed  Comments:       No lifting for 6 weeks    TED hose  As directed     Comments:      Use stockings (TED hose) for 3 weeks on both leg(s).  You may remove them at night for sleeping.       DISCHARGE MEDICATIONS:     Medication List    TAKE these medications       atenolol 25 MG tablet  Commonly known as:  TENORMIN  Take 25 mg by mouth daily.     calcium citrate 950 MG tablet  Commonly known as:  CALCITRATE - dosed in mg elemental calcium  Take 1 tablet by mouth daily.     celecoxib 200 MG capsule  Commonly known as:  CELEBREX  Take 1 capsule (200 mg total) by mouth every 12 (twelve) hours.     Co Q 10 100 MG Caps  Take 1 capsule by mouth daily.     enoxaparin 40 MG/0.4ML injection  Commonly known as:  LOVENOX  Inject 0.4 mLs (40 mg total) into the skin daily.     JUICE PLUS FIBRE PO  Take 2 tablets by mouth daily.     meclizine 25 MG tablet  Commonly known as:  ANTIVERT  Take 12.5 mg by mouth 3 (three) times daily as needed for dizziness.     methocarbamol 500 MG tablet  Commonly known as:  ROBAXIN  Take 1 tablet (500 mg total) by mouth every 6 (six) hours as needed.     oxyCODONE 5 MG immediate release tablet  Commonly known as:  Oxy IR/ROXICODONE  Take 1-2 tablets (5-10 mg total) by mouth every 4 (four) hours as needed.     TRIPLE FLEX PO  Take 2 capsules by mouth daily.     VITAMIN C PO  Take 1 tablet by mouth daily.     Vitamin D3 5000 UNITS Tabs  Take 1 tablet by mouth daily.        FOLLOW UP VISIT:       Follow-up Information   Follow up with Raymon Mutton, MD. Call on 02/06/2013.   Contact information:   [address] [place] [address] [phone]       DISPOSITION: HOME  CONDITION:  Good   Rmani Kapusta 01/23/2013, 4:54 PM

## 2013-02-02 ENCOUNTER — Telehealth: Payer: Self-pay | Admitting: Family Medicine

## 2013-02-02 NOTE — Telephone Encounter (Signed)
Therapist calling to report patient's elevated BP readings:  Monday 4/7 - 158/64 Tuesday 4/8 - 160/72 Wednesday 4/9 - 152/70 Today before therapy 178/82, after therapy 190/92  Patient is 11 days post knee replacement surgery.

## 2013-02-02 NOTE — Telephone Encounter (Signed)
Would advise appt for follow up and to discuss HTN. Thanks.

## 2013-02-05 ENCOUNTER — Ambulatory Visit (INDEPENDENT_AMBULATORY_CARE_PROVIDER_SITE_OTHER): Payer: Medicare Other | Admitting: Family Medicine

## 2013-02-05 ENCOUNTER — Encounter: Payer: Self-pay | Admitting: Family Medicine

## 2013-02-05 VITALS — BP 160/70 | HR 78 | Temp 97.9°F | Wt 165.0 lb

## 2013-02-05 DIAGNOSIS — M1711 Unilateral primary osteoarthritis, right knee: Secondary | ICD-10-CM

## 2013-02-05 DIAGNOSIS — M1710 Unilateral primary osteoarthritis, unspecified knee: Secondary | ICD-10-CM

## 2013-02-05 DIAGNOSIS — IMO0002 Reserved for concepts with insufficient information to code with codable children: Secondary | ICD-10-CM

## 2013-02-05 DIAGNOSIS — I10 Essential (primary) hypertension: Secondary | ICD-10-CM

## 2013-02-05 DIAGNOSIS — I4949 Other premature depolarization: Secondary | ICD-10-CM

## 2013-02-05 DIAGNOSIS — M25561 Pain in right knee: Secondary | ICD-10-CM

## 2013-02-05 DIAGNOSIS — M25569 Pain in unspecified knee: Secondary | ICD-10-CM

## 2013-02-05 DIAGNOSIS — I493 Ventricular premature depolarization: Secondary | ICD-10-CM

## 2013-02-05 MED ORDER — HYDROCHLOROTHIAZIDE 25 MG PO TABS: 25.0000 mg | ORAL_TABLET | Freq: Every day | ORAL | Status: DC

## 2013-02-05 NOTE — Patient Instructions (Addendum)
Start the hydrochlorothiazide daily, continue the atenolol  Follow up in 1 month

## 2013-02-05 NOTE — Progress Notes (Signed)
Chief Complaint  Patient presents with  . Hypertension    HPI:  Follow up:  Hypertension/palpitations: -continues to take atenolol, used to take hctz and did fine on this - then BP can down and she had stopped this -has had some elevated BP readings at home prior to PT for her knee and PT contacted [place] -BP has been running 150-180s/60-70s and reports  -denies: CP, SOB, vision changes  Prediabetes/Hylperlipidemia: -managed with lifestyle recs  S/p R TKA: -surgery on 3/31 -doing ok, but still with significant pain and was not taking tramadol as was nervous about taking this and the muscle relaxer, getting physical therapy -seeing her surgeon tomorrow and is going to continue physical therapy  Occ vertigo: -work up in the past per pt -take meclizine rarely for this  ROS: See pertinent positives and negatives per HPI.  Past Medical History  Diagnosis Date  . Chicken pox   . Seasonal allergies   . Hypertension     high readings  . High cholesterol   . Ovarian cyst 2007  . Vertigo   . SVT (supraventricular tachycardia)   . Neuromuscular disorder     numbness&tingling arms &hands  . PONV (postoperative nausea and vomiting)     everytime, always sick with anesth.   . Arthritis     "both knees" (01/22/2013)  . Basal cell carcinoma of back ` 1980's    Family History  Problem Relation Age of Onset  . Stroke Mother 30  . Heart disease Father 20    MI  . Alcohol abuse      parent  . Alcohol abuse      grandparent  . Arthritis      parent  . Hyperlipidemia      parent  . Hypertension      parent    History   Social History  . Marital Status: Married    Spouse Name: N/A    Number of Children: N/A  . Years of Education: N/A   Social History Main Topics  . Smoking status: Never Smoker   . Smokeless tobacco: Never Used  . Alcohol Use: No  . Drug Use: No  . Sexually Active: No   Other Topics Concern  . None   Social History Narrative   Work or School:  homemaker      Home Situation: lives with husband who has parkinson and dementia - she is the sole caregiver      Spiritual Beliefs: christian      Lifestyle: was exercising until the summer - walks about 1/3 of a mile on a regular basis, only limited by knee pain, can walk of a flight of steps without any SOB             Current outpatient prescriptions:Ascorbic Acid (VITAMIN C PO), Take 1 tablet by mouth daily., Disp: , Rfl: ;  atenolol (TENORMIN) 25 MG tablet, Take 25 mg by mouth daily., Disp: , Rfl: ;  calcium citrate (CALCITRATE - DOSED IN MG ELEMENTAL CALCIUM) 950 MG tablet, Take 1 tablet by mouth daily., Disp: , Rfl: ;  Cholecalciferol (VITAMIN D3) 5000 UNITS TABS, Take 1 tablet by mouth daily., Disp: , Rfl:  Coenzyme Q10 (CO Q 10) 100 MG CAPS, Take 1 capsule by mouth daily., Disp: , Rfl: ;  Glucosamine-Chondroitin-MSM (TRIPLE FLEX PO), Take 2 capsules by mouth daily., Disp: , Rfl: ;  ibuprofen (ADVIL,MOTRIN) 200 MG tablet, Take 200 mg by mouth every 6 (six) hours as needed for pain.,  Disp: , Rfl: ;  meclizine (ANTIVERT) 25 MG tablet, Take 12.5 mg by mouth 3 (three) times daily as needed for dizziness., Disp: , Rfl:  methocarbamol (ROBAXIN) 500 MG tablet, Take 1 tablet (500 mg total) by mouth every 6 (six) hours as needed., Disp: 60 tablet, Rfl: 0;  Nutritional Supplements (JUICE PLUS FIBRE PO), Take 2 tablets by mouth daily., Disp: , Rfl: ;  traMADol (ULTRAM) 50 MG tablet, Take 50 mg by mouth daily., Disp: , Rfl: ;  hydrochlorothiazide (HYDRODIURIL) 25 MG tablet, Take 1 tablet (25 mg total) by mouth daily., Disp: 30 tablet, Rfl: 3  EXAM:  Filed Vitals:   02/05/13 1312  BP: 160/70  Pulse: 78  Temp: 97.9 F (36.6 C)    Body mass index is 26.64 kg/(m^2).  GENERAL: vitals reviewed and listed above, alert, oriented, appears well hydrated and in no acute distress  HEENT: atraumatic, conjunttiva clear, no obvious abnormalities on inspection of external nose and ears  NECK: no obvious  masses on inspection  LUNGS: clear to auscultation bilaterally, no wheezes, rales or rhonchi, good air movement  CV: HRRR, no peripheral edema  MS: moves all extremities without noticeable abnormality  PSYCH: pleasant and cooperative, no obvious depression or anxiety  ASSESSMENT AND PLAN:  Discussed the following assessment and plan:  Hypertension - Plan: hydrochlorothiazide (HYDRODIURIL) 25 MG tablet  Osteoarthritis of right knee  PVC   and PAC s  Right knee pain  -discussed options for HTN - will add back HCTZ as she tolerated this well -advised it is ok to take the tramadol for the pain for the next few weeks and cont to work with PT - has f/u with ortho today -follow up in 1 month -Patient advised to return or notify a doctor immediately if symptoms worsen or persist or new concerns arise.  Patient Instructions  Start the hydrochlorothiazide daily, continue the atenolol  Follow up in 1 month     KIM, HANNAH R.

## 2013-02-19 ENCOUNTER — Ambulatory Visit: Payer: Medicare Other | Admitting: Family Medicine

## 2013-03-06 ENCOUNTER — Encounter: Payer: Self-pay | Admitting: Family Medicine

## 2013-03-06 ENCOUNTER — Ambulatory Visit (INDEPENDENT_AMBULATORY_CARE_PROVIDER_SITE_OTHER): Payer: Medicare Other | Admitting: Family Medicine

## 2013-03-06 VITALS — BP 130/78 | Temp 98.3°F | Wt 164.0 lb

## 2013-03-06 DIAGNOSIS — I10 Essential (primary) hypertension: Secondary | ICD-10-CM

## 2013-03-06 NOTE — Progress Notes (Signed)
Chief Complaint  Patient presents with  . 1 month follow up    HPI:  Follow up HTN: -restarted HCTZ last visit - she had been on this in the past and had done well -reports she has been checking BP at home and has been good -denies: CP, SOB, swelling, palpitations -knee is improving, doing PT, watching diet - does not eat a lot of sweets  ROS: See pertinent positives and negatives per HPI.  Past Medical History  Diagnosis Date  . Chicken pox   . Seasonal allergies   . Hypertension     high readings  . High cholesterol   . Ovarian cyst 2007  . Vertigo   . SVT (supraventricular tachycardia)   . Neuromuscular disorder     numbness&tingling arms &hands  . PONV (postoperative nausea and vomiting)     everytime, always sick with anesth.   . Arthritis     "both knees" (01/22/2013)  . Basal cell carcinoma of back ` 1980's    Family History  Problem Relation Age of Onset  . Stroke Mother 43  . Heart disease Father 6    MI  . Alcohol abuse      parent  . Alcohol abuse      grandparent  . Arthritis      parent  . Hyperlipidemia      parent  . Hypertension      parent    History   Social History  . Marital Status: Married    Spouse Name: N/A    Number of Children: N/A  . Years of Education: N/A   Social History Main Topics  . Smoking status: Never Smoker   . Smokeless tobacco: Never Used  . Alcohol Use: No  . Drug Use: No  . Sexually Active: No   Other Topics Concern  . None   Social History Narrative   Work or School: homemaker      Home Situation: lives with husband who has parkinson and dementia - she is the sole caregiver      Spiritual Beliefs: christian      Lifestyle: was exercising until the summer - walks about 1/3 of a mile on a regular basis, only limited by knee pain, can walk of a flight of steps without any SOB             Current outpatient prescriptions:Ascorbic Acid (VITAMIN C PO), Take 1 tablet by mouth daily., Disp: , Rfl: ;   atenolol (TENORMIN) 25 MG tablet, Take 25 mg by mouth daily., Disp: , Rfl: ;  calcium citrate (CALCITRATE - DOSED IN MG ELEMENTAL CALCIUM) 950 MG tablet, Take 1 tablet by mouth daily., Disp: , Rfl: ;  Cholecalciferol (VITAMIN D3) 5000 UNITS TABS, Take 1 tablet by mouth daily., Disp: , Rfl:  Coenzyme Q10 (CO Q 10) 100 MG CAPS, Take 1 capsule by mouth daily., Disp: , Rfl: ;  Glucosamine-Chondroitin-MSM (TRIPLE FLEX PO), Take 2 capsules by mouth daily., Disp: , Rfl: ;  hydrochlorothiazide (HYDRODIURIL) 25 MG tablet, Take 1 tablet (25 mg total) by mouth daily., Disp: 30 tablet, Rfl: 3;  ibuprofen (ADVIL,MOTRIN) 200 MG tablet, Take 200 mg by mouth every 6 (six) hours as needed for pain., Disp: , Rfl:  meclizine (ANTIVERT) 25 MG tablet, Take 12.5 mg by mouth 3 (three) times daily as needed for dizziness., Disp: , Rfl: ;  methocarbamol (ROBAXIN) 500 MG tablet, Take 1 tablet (500 mg total) by mouth every 6 (six) hours as needed., Disp:  60 tablet, Rfl: 0;  Nutritional Supplements (JUICE PLUS FIBRE PO), Take 2 tablets by mouth daily., Disp: , Rfl: ;  traMADol (ULTRAM) 50 MG tablet, Take 50 mg by mouth daily., Disp: , Rfl:   EXAM:  Filed Vitals:   03/06/13 1302  BP: 130/78  Temp: 98.3 F (36.8 C)    Body mass index is 26.48 kg/(m^2).  GENERAL: vitals reviewed and listed above, alert, oriented, appears well hydrated and in no acute distress  HEENT: atraumatic, conjunttiva clear, no obvious abnormalities on inspection of external nose and ears  NECK: no obvious masses on inspection  LUNGS: clear to auscultation bilaterally, no wheezes, rales or rhonchi, good air movement  CV: HRRR, no peripheral edema  MS: moves all extremities without noticeable abnormality  PSYCH: pleasant and cooperative, no obvious depression or anxiety  ASSESSMENT AND PLAN:  Discussed the following assessment and plan:  Hypertension  -continue current tx plus lifestyle recs -follow up in 3-4 months for HTN, Prediabetes,  HLD - labs then -Patient advised to return or notify a doctor immediately if symptoms worsen or persist or new concerns arise.  There are no Patient Instructions on file for this visit.   Kriste Basque R.

## 2013-05-29 ENCOUNTER — Other Ambulatory Visit: Payer: Self-pay | Admitting: Family Medicine

## 2013-06-26 ENCOUNTER — Ambulatory Visit (INDEPENDENT_AMBULATORY_CARE_PROVIDER_SITE_OTHER): Payer: Medicare Other | Admitting: Family Medicine

## 2013-06-26 ENCOUNTER — Encounter: Payer: Self-pay | Admitting: Family Medicine

## 2013-06-26 VITALS — BP 130/68 | HR 58 | Temp 98.4°F | Wt 166.0 lb

## 2013-06-26 DIAGNOSIS — E785 Hyperlipidemia, unspecified: Secondary | ICD-10-CM

## 2013-06-26 DIAGNOSIS — R7303 Prediabetes: Secondary | ICD-10-CM | POA: Insufficient documentation

## 2013-06-26 DIAGNOSIS — R42 Dizziness and giddiness: Secondary | ICD-10-CM

## 2013-06-26 DIAGNOSIS — R7309 Other abnormal glucose: Secondary | ICD-10-CM

## 2013-06-26 DIAGNOSIS — I10 Essential (primary) hypertension: Secondary | ICD-10-CM

## 2013-06-26 MED ORDER — ATENOLOL 25 MG PO TABS: 25.0000 mg | ORAL_TABLET | Freq: Every day | ORAL | Status: DC

## 2013-06-26 MED ORDER — MECLIZINE HCL 25 MG PO TABS: 12.5000 mg | ORAL_TABLET | Freq: Three times a day (TID) | ORAL | Status: DC | PRN

## 2013-06-26 NOTE — Patient Instructions (Signed)
We recommend the following healthy lifestyle measures: - eat a healthy diet consisting of lots of vegetables, fruits, beans, nuts, seeds, healthy meats such as white chicken and fish and whole grains.  - avoid fried foods, fast food, processed foods, sodas, red meet and other fattening foods.  - get a least 150 minutes of aerobic exercise per week.   Work on portion size  We have ordered labs or studies at this visit. It can take up to 1-2 weeks for results and processing. We will contact you with instructions IF your results are abnormal. Normal results will be released to your [facility]. If you have not heard from [place] or can not find your results in [facility] in 2 weeks please contact our office.  Schedule lab appointment for Thursday and some fasting  Follow up in 4-6 months

## 2013-06-26 NOTE — Progress Notes (Signed)
Chief Complaint  Patient presents with  . Hypertension    HPI:  Follow up:  HTN: -on HCTZ and atenolol -denies: CP, SOB, swelling  HLD: -lifestyle recs -is going to the gym a few times per week  Prediabetes: -advised lifestyle recs   ROS: See pertinent positives and negatives per HPI.  Past Medical History  Diagnosis Date  . Chicken pox   . Seasonal allergies   . Hypertension     high readings  . High cholesterol   . Ovarian cyst 2007  . Vertigo   . SVT (supraventricular tachycardia)   . Neuromuscular disorder     numbness&tingling arms &hands  . PONV (postoperative nausea and vomiting)     everytime, always sick with anesth.   . Arthritis     "both knees" (01/22/2013)  . Basal cell carcinoma of back ` 1980's    Past Surgical History  Procedure Laterality Date  . Tonsillectomy and adenoidectomy  1945  . Combined hysteroscopy diagnostic / d&c  1958  . Combined hysteroscopy diagnostic / d&c  1972    tubal pregnancy  . Cyst excision      ovarian  . Total knee arthroplasty Right 01/22/2013  . Dilation and curettage of uterus    . Cataract extraction w/ intraocular lens  implant, bilateral Bilateral 2012  . Laparoscopic ovarian cystectomy  ~ 2006  . Av node ablation  1995  . Basal cell carcinoma excision  ?1980's    "off my back" (01/22/2013)  . Total knee arthroplasty Right 01/22/2013    Procedure: TOTAL KNEE ARTHROPLASTY;  Surgeon: Dannielle Huh, MD;  Location: [facility];  Service: Orthopedics;  Laterality: Right;    Family History  Problem Relation Age of Onset  . Stroke Mother 8  . Heart disease Father 8    MI  . Alcohol abuse      parent  . Alcohol abuse      grandparent  . Arthritis      parent  . Hyperlipidemia      parent  . Hypertension      parent    History   Social History  . Marital Status: Married    Spouse Name: N/A    Number of Children: N/A  . Years of Education: N/A   Social History Main Topics  . Smoking status: Never Smoker    . Smokeless tobacco: Never Used  . Alcohol Use: No  . Drug Use: No  . Sexual Activity: No   Other Topics Concern  . None   Social History Narrative   Work or School: homemaker      Home Situation: lives with husband who has parkinson and dementia - she is the sole caregiver      Spiritual Beliefs: christian      Lifestyle: was exercising until the summer - walks about 1/3 of a mile on a regular basis, only limited by knee pain, can walk of a flight of steps without any SOB             Current outpatient prescriptions:Ascorbic Acid (VITAMIN C PO), Take 1 tablet by mouth daily., Disp: , Rfl: ;  atenolol (TENORMIN) 25 MG tablet, Take 1 tablet (25 mg total) by mouth daily., Disp: 90 tablet, Rfl: 3;  calcium citrate (CALCITRATE - DOSED IN MG ELEMENTAL CALCIUM) 950 MG tablet, Take 1 tablet by mouth daily., Disp: , Rfl: ;  Cholecalciferol (VITAMIN D3) 5000 UNITS TABS, Take 1 tablet by mouth daily., Disp: , Rfl:  Coenzyme  Q10 (CO Q 10) 100 MG CAPS, Take 1 capsule by mouth daily., Disp: , Rfl: ;  Glucosamine-Chondroitin-MSM (TRIPLE FLEX PO), Take 2 capsules by mouth daily., Disp: , Rfl: ;  hydrochlorothiazide (HYDRODIURIL) 25 MG tablet, TAKE ONE TABLET BY MOUTH ONCE DAILY, Disp: 90 tablet, Rfl: 3;  ibuprofen (ADVIL,MOTRIN) 200 MG tablet, Take 200 mg by mouth every 6 (six) hours as needed for pain., Disp: , Rfl:  meclizine (ANTIVERT) 25 MG tablet, Take 0.5 tablets (12.5 mg total) by mouth 3 (three) times daily as needed for dizziness., Disp: 30 tablet, Rfl: 1;  methocarbamol (ROBAXIN) 500 MG tablet, Take 1 tablet (500 mg total) by mouth every 6 (six) hours as needed., Disp: 60 tablet, Rfl: 0;  Nutritional Supplements (JUICE PLUS FIBRE PO), Take 2 tablets by mouth daily., Disp: , Rfl:  traMADol (ULTRAM) 50 MG tablet, Take 50 mg by mouth daily., Disp: , Rfl:   EXAM:  Filed Vitals:   06/26/13 1011  BP: 130/68  Pulse: 58  Temp: 98.4 F (36.9 C)    Body mass index is 26.81  kg/(m^2).  GENERAL: vitals reviewed and listed above, alert, oriented, appears well hydrated and in no acute distress  HEENT: atraumatic, conjunttiva clear, no obvious abnormalities on inspection of external nose and ears  NECK: no obvious masses on inspection  LUNGS: clear to auscultation bilaterally, no wheezes, rales or rhonchi, good air movement  CV: HRRR, no peripheral edema  MS: moves all extremities without noticeable abnormality  PSYCH: pleasant and cooperative, no obvious depression or anxiety  ASSESSMENT AND PLAN:  Discussed the following assessment and plan:  Hypertension - Plan: Basic metabolic panel, atenolol (TENORMIN) 25 MG tablet  Prediabetes - Plan: Hemoglobin A1c  Hyperlipemia - Plan: Lipid Panel  Vertigo - Plan: meclizine (ANTIVERT) 25 MG tablet  -lifestyle counseling -FASTING LABS -discussed options for hyperlipidemia if still high -Patient advised to return or notify a doctor immediately if symptoms worsen or persist or new concerns arise.  Patient Instructions  We recommend the following healthy lifestyle measures: - eat a healthy diet consisting of lots of vegetables, fruits, beans, nuts, seeds, healthy meats such as white chicken and fish and whole grains.  - avoid fried foods, fast food, processed foods, sodas, red meet and other fattening foods.  - get a least 150 minutes of aerobic exercise per week.   Work on portion size  We have ordered labs or studies at this visit. It can take up to 1-2 weeks for results and processing. We will contact you with instructions IF your results are abnormal. Normal results will be released to your [facility]. If you have not heard from [place] or can not find your results in [facility] in 2 weeks please contact our office.  Schedule lab appointment for Thursday and some fasting  Follow up in 4-6 months               KIM, HANNAH R.

## 2013-06-28 ENCOUNTER — Other Ambulatory Visit (INDEPENDENT_AMBULATORY_CARE_PROVIDER_SITE_OTHER): Payer: Medicare Other

## 2013-06-28 DIAGNOSIS — R7309 Other abnormal glucose: Secondary | ICD-10-CM

## 2013-06-28 DIAGNOSIS — R7303 Prediabetes: Secondary | ICD-10-CM

## 2013-06-28 DIAGNOSIS — I10 Essential (primary) hypertension: Secondary | ICD-10-CM

## 2013-06-28 DIAGNOSIS — E785 Hyperlipidemia, unspecified: Secondary | ICD-10-CM

## 2013-06-28 LAB — BASIC METABOLIC PANEL
CO2: 30 mEq/L (ref 19–32)
Calcium: 8.9 mg/dL (ref 8.4–10.5)
Glucose, Bld: 89 mg/dL (ref 70–99)
Potassium: 3.8 mEq/L (ref 3.5–5.1)
Sodium: 140 mEq/L (ref 135–145)

## 2013-06-28 LAB — LIPID PANEL: HDL: 49.2 mg/dL (ref >39.00)

## 2013-06-28 LAB — HEMOGLOBIN A1C: Hgb A1c MFr Bld: 6.1 % (ref 4.6–6.5)

## 2013-06-29 NOTE — Progress Notes (Signed)
Quick Note:  Called and spoke with pt and pt is aware. Pt would like to continue working on diet. ______

## 2013-06-29 NOTE — Progress Notes (Signed)
Quick Note:  Left a message for return call. ______ 

## 2013-09-05 ENCOUNTER — Ambulatory Visit (INDEPENDENT_AMBULATORY_CARE_PROVIDER_SITE_OTHER): Payer: Medicare Other | Admitting: Gynecology

## 2013-09-05 ENCOUNTER — Encounter: Payer: Self-pay | Admitting: Gynecology

## 2013-09-05 VITALS — BP 130/74 | Ht 66.25 in | Wt 163.0 lb

## 2013-09-05 DIAGNOSIS — Z4689 Encounter for fitting and adjustment of other specified devices: Secondary | ICD-10-CM

## 2013-09-05 DIAGNOSIS — N814 Uterovaginal prolapse, unspecified: Secondary | ICD-10-CM

## 2013-09-05 HISTORY — DX: Uterovaginal prolapse, unspecified: N81.4

## 2013-09-05 MED ORDER — NONFORMULARY OR COMPOUNDED ITEM: Status: DC

## 2013-09-05 NOTE — Patient Instructions (Addendum)
Please make sure your primary doctor schedules your bone density  We will call you when  your pessary comes in. We will then have you come every three months to clean it for you. You will continue to apply the estrogen cream vaginally twice a week.Place cystocele, rectocele, and pessary patient instructions here.

## 2013-09-05 NOTE — Progress Notes (Signed)
Patient is a 79 year old gravida 10 para 8 AB 4 who presented to the office today with complaint of vaginal bulging and discomfort with a ring pessary that was placed by her previous provider. She states that her urinary stream is not straight it goes in different directions. She has not been interested any surgical intervention. She has been using estrogen vaginal cream twice a week. She has been changing the pessary herself but does not feel that it is helping. She denies any urinary incontinence. All her children were previously delivered vaginally. Pap smear was normal last year.  Please see past medical history in chart for further details.  Exam: Bartholin's urethra Skene glands with atrophic changes Vagina the ring pessary was removed and on Valsalva patient with a protruding cystocele although the cervix appears distended mildly.There was a mild first degree rectocele. Patient was examined in the supine and erect position I do not feel an enterocele.  Q-tip angle test was greater than 30.   The patient was fitted for a different type of pessary such as a Gellhorn 2-3/4 size   Patient will return back to the office in a few days to place her pessary. She may then come to the office every 3-4 months to remove them for cleaning. She should use estrogen vaginal cream twice a week. We will also have her see the urologist Dr. Perley Jain in consultation.

## 2013-09-06 ENCOUNTER — Telehealth: Payer: Self-pay | Admitting: *Deleted

## 2013-09-06 NOTE — Telephone Encounter (Signed)
Message copied by Aura Camps on Thu Sep 06, 2013  9:39 AM ------      Message from: Ok Edwards      Created: Wed Sep 05, 2013  3:25 PM       Victorino Dike, please schedule appointment for this patient with Dr. McDiarimid at [facility] urology. ------

## 2013-09-06 NOTE — Telephone Encounter (Signed)
appt on 09/17/13 @ 2:00 pm with Dr.MacDiarmid notes faxed. Pt informed.

## 2013-10-01 ENCOUNTER — Encounter: Payer: Self-pay | Admitting: Gynecology

## 2013-10-01 ENCOUNTER — Ambulatory Visit (INDEPENDENT_AMBULATORY_CARE_PROVIDER_SITE_OTHER): Payer: Medicare Other | Admitting: Gynecology

## 2013-10-01 VITALS — BP 130/86

## 2013-10-01 DIAGNOSIS — N819 Female genital prolapse, unspecified: Secondary | ICD-10-CM

## 2013-10-01 NOTE — Progress Notes (Signed)
   Patient is a 79 year old gravida 10 para 8 AB 2 who was seen in the office as a new patient on 09/05/2013 with complaint of vaginal bulging and discomfort with a ring pessary that was placed by her previous provider. She states that her urinary stream is not straight it goes in different directions. She has not been interested any surgical intervention. She has been using estrogen vaginal cream twice a week. She has been changing the pessary herself but does not feel that it is helping. She denies any urinary incontinence. All her children were previously delivered vaginally. Pap smear was normal last year.   Patient had a vaginal ring pessary which was removed and it was noted on Valsalva maneuver that patient had a protruding cystocele although the cervix descended mildly. There was a first degree rectocele. Patient was examined in the supine and erect position I do not feel an enterocele.  Q-tip angle test was greater than 30.   The patient was fitted for a different type of pessary such as a Gellhorn 2-3/4 size. The patient was adamant on any surgical intervention but she was referred to the urologist Dr. Perley Jain for consultation which she did see. She would like to try this new pessary and avoid surgery at all possible. She was prescribed also estradiol vaginal cream 0.02% to apply twice a week. If she has trouble removing the pessary within 1 week at home to cleaning she will return to the office in one month. Then we will have her come to the office every 3 months to clean and inspected vagina and 2 reinserted again. Instructions were provided.

## 2013-10-30 ENCOUNTER — Ambulatory Visit: Payer: Medicare Other | Admitting: Family Medicine

## 2013-12-21 ENCOUNTER — Ambulatory Visit (INDEPENDENT_AMBULATORY_CARE_PROVIDER_SITE_OTHER): Payer: Medicare HMO | Admitting: Family Medicine

## 2013-12-21 ENCOUNTER — Encounter: Payer: Self-pay | Admitting: Family Medicine

## 2013-12-21 VITALS — BP 142/70 | HR 66 | Temp 98.0°F | Wt 166.0 lb

## 2013-12-21 DIAGNOSIS — E0789 Other specified disorders of thyroid: Secondary | ICD-10-CM

## 2013-12-21 DIAGNOSIS — D497 Neoplasm of unspecified behavior of endocrine glands and other parts of nervous system: Secondary | ICD-10-CM

## 2013-12-21 DIAGNOSIS — J069 Acute upper respiratory infection, unspecified: Secondary | ICD-10-CM

## 2013-12-21 LAB — TSH: TSH: 0.29 u[IU]/mL — ABNORMAL LOW (ref 0.35–5.50)

## 2013-12-21 LAB — T4, FREE: FREE T4: 0.76 ng/dL (ref 0.60–1.60)

## 2013-12-21 NOTE — Progress Notes (Signed)
Chief Complaint  Patient presents with  . Sore Throat    HPI:  Acute visit for sore throat: -started 4 days -symptoms: runny nose, PND, sore throat, cough -has tried nasal saline and gargling  ROS: See pertinent positives and negatives per HPI.  Past Medical History  Diagnosis Date  . Chicken pox   . Seasonal allergies   . Hypertension     high readings  . High cholesterol   . Ovarian cyst 2007  . Vertigo   . SVT (supraventricular tachycardia)   . Neuromuscular disorder     numbness&tingling arms &hands  . PONV (postoperative nausea and vomiting)     everytime, always sick with anesth.   . Arthritis     "both knees" (01/22/2013)  . Basal cell carcinoma of back ` 1980's    Past Surgical History  Procedure Laterality Date  . Tonsillectomy and adenoidectomy  1945  . Combined hysteroscopy diagnostic / d&c  1958  . Combined hysteroscopy diagnostic / d&c  1972    tubal pregnancy  . Cyst excision      ovarian  . Total knee arthroplasty Right 01/22/2013  . Dilation and curettage of uterus    . Cataract extraction w/ intraocular lens  implant, bilateral Bilateral 2012  . Laparoscopic ovarian cystectomy  ~ 2006  . Av node ablation  1995  . Basal cell carcinoma excision  ?1980's    "off my back" (01/22/2013)  . Total knee arthroplasty Right 01/22/2013    Procedure: TOTAL KNEE ARTHROPLASTY;  Surgeon: Vickey Huger, MD;  Location: [facility];  Service: Orthopedics;  Laterality: Right;    Family History  Problem Relation Age of Onset  . Stroke Mother 80  . Heart disease Father 84    MI  . Alcohol abuse      parent  . Alcohol abuse      grandparent  . Arthritis      parent  . Hyperlipidemia      parent  . Hypertension      parent    History   Social History  . Marital Status: Married    Spouse Name: N/A    Number of Children: N/A  . Years of Education: N/A   Social History Main Topics  . Smoking status: Never Smoker   . Smokeless tobacco: Never Used  . Alcohol  Use: No  . Drug Use: No  . Sexual Activity: No   Other Topics Concern  . None   Social History Narrative   Work or School: homemaker      Home Situation: lives with husband who has parkinson and dementia - she is the sole caregiver      Spiritual Beliefs: christian      Lifestyle: was exercising until the summer - walks about 1/3 of a mile on a regular basis, only limited by knee pain, can walk of a flight of steps without any SOB             Current outpatient prescriptions:Ascorbic Acid (VITAMIN C PO), Take 1 tablet by mouth daily., Disp: , Rfl: ;  atenolol (TENORMIN) 25 MG tablet, Take 1 tablet (25 mg total) by mouth daily., Disp: 90 tablet, Rfl: 3;  calcium citrate (CALCITRATE - DOSED IN MG ELEMENTAL CALCIUM) 950 MG tablet, Take 1 tablet by mouth daily., Disp: , Rfl: ;  Cholecalciferol (VITAMIN D3) 5000 UNITS TABS, Take 1 tablet by mouth daily., Disp: , Rfl:  Coenzyme Q10 (CO Q 10) 100 MG CAPS, Take 1 capsule  by mouth daily., Disp: , Rfl: ;  Glucosamine-Chondroitin-MSM (TRIPLE FLEX PO), Take 2 capsules by mouth daily., Disp: , Rfl: ;  hydrochlorothiazide (HYDRODIURIL) 25 MG tablet, TAKE ONE TABLET BY MOUTH ONCE DAILY, Disp: 90 tablet, Rfl: 3;  ibuprofen (ADVIL,MOTRIN) 200 MG tablet, Take 200 mg by mouth every 6 (six) hours as needed for pain., Disp: , Rfl:  meclizine (ANTIVERT) 25 MG tablet, Take 0.5 tablets (12.5 mg total) by mouth 3 (three) times daily as needed for dizziness., Disp: 30 tablet, Rfl: 1;  NONFORMULARY OR COMPOUNDED ITEM, Estradiol .02% 1 ML Prefilled Applicator Sig: apply vaginally twice a week #90 Day Supply with 4 refills, Disp: 1 each, Rfl: 4;  Nutritional Supplements (JUICE PLUS FIBRE PO), Take 2 tablets by mouth daily., Disp: , Rfl:   EXAM:  Filed Vitals:   12/21/13 1408  BP: 142/70  Pulse: 66  Temp: 98 F (36.7 C)    Body mass index is 26.58 kg/(m^2).  GENERAL: vitals reviewed and listed above, alert, oriented, appears well hydrated and in no acute  distress  HEENT: atraumatic, conjunttiva clear, no obvious abnormalities on inspection of external nose and ears, normal appearance of ear canals and TMs, clear nasal congestion, mild post oropharyngeal erythema with PND, no tonsillar edema or exudate, no sinus TTP   NECK:fullness over thyroid R>L  LUNGS: clear to auscultation bilaterally, no wheezes, rales or rhonchi, good air movement  CV: HRRR, no peripheral edema  MS: moves all extremities without noticeable abnormality  PSYCH: pleasant and cooperative, no obvious depression or anxiety  ASSESSMENT AND PLAN:  Discussed the following assessment and plan:  Thyroid mass of unclear etiology - Plan: TSH, T4, Free, US Soft Tissue Head/Neck  Viral upper respiratory infection  -supportive care for viral upper resp infection -mass of neck found incidentally on exam and she had not noticed it - discussed potential etiologies and she was reluctant to evaluate but agreeed to TFT and [place] - ordered -follow up in 2 weeks or sooner as needed -Patient advised to return or notify a doctor immediately if symptoms worsen or persist or new concerns arise.  Patient Instructions  INSTRUCTIONS FOR UPPER RESPIRATORY INFECTION:  -plenty of rest and fluids  -We placed a referral for you as discussed for the ultrasound. It usually takes about 1-2 weeks to process and schedule this referral. If you have not heard from [place] regarding this appointment in 2 weeks please contact our office.  -nasal saline wash 2-3 times daily (use prepackaged nasal saline or bottled/distilled water if making your own)   -clean nose with nasal saline before using the nasal steroid or sinex  -can use sinex or afrin nasal spray for drainage and nasal congestion - but do NOT use longer then 3-4 days  -can use tylenol or ibuprofen as directed for aches and sorethroat  -in the winter time, using a humidifier at night is helpful (please follow cleaning instructions)  -if you  are taking a cough medication - use only as directed, may also try a teaspoon of honey to coat the throat and throat lozenges  -for sore throat, salt water gargles can help  -follow up if you have fevers, facial pain, tooth pain, difficulty breathing or are worsening or not getting better in 5-7 days      KIM, HANNAH R.

## 2013-12-21 NOTE — Patient Instructions (Signed)
INSTRUCTIONS FOR UPPER RESPIRATORY INFECTION:  -plenty of rest and fluids  -We placed a referral for you as discussed for the ultrasound. It usually takes about 1-2 weeks to process and schedule this referral. If you have not heard from [place] regarding this appointment in 2 weeks please contact our office.  -nasal saline wash 2-3 times daily (use prepackaged nasal saline or bottled/distilled water if making your own)   -clean nose with nasal saline before using the nasal steroid or sinex  -can use sinex or afrin nasal spray for drainage and nasal congestion - but do NOT use longer then 3-4 days  -can use tylenol or ibuprofen as directed for aches and sorethroat  -in the winter time, using a humidifier at night is helpful (please follow cleaning instructions)  -if you are taking a cough medication - use only as directed, may also try a teaspoon of honey to coat the throat and throat lozenges  -for sore throat, salt water gargles can help  -follow up if you have fevers, facial pain, tooth pain, difficulty breathing or are worsening or not getting better in 5-7 days

## 2013-12-21 NOTE — Progress Notes (Signed)
Pre visit review using our clinic review tool, if applicable. No additional management support is needed unless otherwise documented below in the visit note. 

## 2013-12-27 ENCOUNTER — Other Ambulatory Visit: Payer: Self-pay | Admitting: Family Medicine

## 2013-12-27 ENCOUNTER — Ambulatory Visit
Admission: RE | Admit: 2013-12-27 | Discharge: 2013-12-27 | Disposition: A | Discharge status: Home / Self Care | Payer: Medicare HMO | Source: Ambulatory Visit | Attending: Family Medicine | Admitting: Family Medicine

## 2013-12-27 DIAGNOSIS — E079 Disorder of thyroid, unspecified: Secondary | ICD-10-CM

## 2013-12-27 DIAGNOSIS — E0789 Other specified disorders of thyroid: Secondary | ICD-10-CM

## 2014-01-01 ENCOUNTER — Other Ambulatory Visit: Payer: Self-pay | Admitting: Orthopedic Surgery

## 2014-01-01 DIAGNOSIS — M25511 Pain in right shoulder: Secondary | ICD-10-CM

## 2014-01-02 ENCOUNTER — Ambulatory Visit: Payer: Medicare Other | Admitting: Family Medicine

## 2014-01-03 ENCOUNTER — Other Ambulatory Visit: Payer: Self-pay | Admitting: Otolaryngology

## 2014-01-03 ENCOUNTER — Other Ambulatory Visit: Payer: Self-pay | Admitting: Physician Assistant

## 2014-01-03 DIAGNOSIS — E042 Nontoxic multinodular goiter: Secondary | ICD-10-CM

## 2014-01-07 ENCOUNTER — Other Ambulatory Visit: Payer: Medicare HMO

## 2014-01-08 ENCOUNTER — Ambulatory Visit
Admission: RE | Admit: 2014-01-08 | Discharge: 2014-01-08 | Disposition: A | Discharge status: Home / Self Care | Payer: Medicare HMO | Source: Ambulatory Visit | Attending: Otolaryngology | Admitting: Otolaryngology

## 2014-01-08 ENCOUNTER — Other Ambulatory Visit: Payer: Self-pay | Admitting: Otolaryngology

## 2014-01-08 DIAGNOSIS — E042 Nontoxic multinodular goiter: Secondary | ICD-10-CM

## 2014-01-14 NOTE — Progress Notes (Signed)
Received notification from [facility] from visit on 3.19.15.  Pt's original ultrasound showed a goiter with several discrete nodules.  Pt sent back for ultrasound-guisded needle aspirations of these Dr. Darryl Nestle did not think any of them required biopsy.  Recommended yearly ultrasound and TSH.

## 2014-01-14 NOTE — Progress Notes (Signed)
Received office notes from [facility] from visit on 3.11.15.  Pt seen for multinodular thyroid and tinnitus.  Pt may return to audiogram .  Options discussed for thyroid nodules- pending results pt will be contacted. Sent to scan.

## 2014-01-16 ENCOUNTER — Encounter: Payer: Self-pay | Admitting: Family Medicine

## 2014-01-16 DIAGNOSIS — E049 Nontoxic goiter, unspecified: Secondary | ICD-10-CM | POA: Insufficient documentation

## 2014-01-16 HISTORY — DX: Nontoxic goiter, unspecified: E04.9

## 2014-02-14 ENCOUNTER — Encounter: Payer: Self-pay | Admitting: Family Medicine

## 2014-02-14 ENCOUNTER — Ambulatory Visit (INDEPENDENT_AMBULATORY_CARE_PROVIDER_SITE_OTHER): Payer: Medicare HMO | Admitting: Family Medicine

## 2014-02-14 VITALS — BP 130/70 | HR 63 | Temp 98.3°F | Ht 66.25 in | Wt 169.0 lb

## 2014-02-14 DIAGNOSIS — E785 Hyperlipidemia, unspecified: Secondary | ICD-10-CM

## 2014-02-14 DIAGNOSIS — R7303 Prediabetes: Secondary | ICD-10-CM

## 2014-02-14 DIAGNOSIS — R7309 Other abnormal glucose: Secondary | ICD-10-CM

## 2014-02-14 DIAGNOSIS — I10 Essential (primary) hypertension: Secondary | ICD-10-CM

## 2014-02-14 NOTE — Progress Notes (Signed)
Pre visit review using our clinic review tool, if applicable. No additional management support is needed unless otherwise documented below in the visit note. 

## 2014-02-14 NOTE — Patient Instructions (Addendum)
-  schedule lab visit to come do fasting labs sometime in the next few weeks  -follow up in 4-6 months for you medicare wellness exam

## 2014-02-14 NOTE — Progress Notes (Signed)
No chief complaint on file.   HPI:  HTN/PVCs/SVT: -on atenolol, hctz -denies: CP, SOB, swelling  HLD/Prediabetes: -last check in sept 2015  Thyroid Nodules: -evaluated by ENT and advised yearly [place] and TSH  OA knees: -on clucosmine-chondrointin  ROS: See pertinent positives and negatives per HPI.  Past Medical History  Diagnosis Date  . Chicken pox   . Seasonal allergies   . Hypertension     high readings  . High cholesterol   . Ovarian cyst 2007  . Vertigo   . SVT (supraventricular tachycardia)   . Neuromuscular disorder     numbness&tingling arms &hands  . PONV (postoperative nausea and vomiting)     everytime, always sick with anesth.   . Arthritis     "both knees" (01/22/2013)  . Basal cell carcinoma of back ` 1980's  . Cystocele or rectocele with uterine prolapse 09/05/2013    Past Surgical History  Procedure Laterality Date  . Tonsillectomy and adenoidectomy  1945  . Combined hysteroscopy diagnostic / d&c  1958  . Combined hysteroscopy diagnostic / d&c  1972    tubal pregnancy  . Cyst excision      ovarian  . Total knee arthroplasty Right 01/22/2013  . Dilation and curettage of uterus    . Cataract extraction w/ intraocular lens  implant, bilateral Bilateral 2012  . Laparoscopic ovarian cystectomy  ~ 2006  . Av node ablation  1995  . Basal cell carcinoma excision  ?1980's    "off my back" (01/22/2013)  . Total knee arthroplasty Right 01/22/2013    Procedure: TOTAL KNEE ARTHROPLASTY;  Surgeon: Vickey Huger, MD;  Location: [facility];  Service: Orthopedics;  Laterality: Right;    Family History  Problem Relation Age of Onset  . Stroke Mother 90  . Heart disease Father 14    MI  . Alcohol abuse      parent  . Alcohol abuse      grandparent  . Arthritis      parent  . Hyperlipidemia      parent  . Hypertension      parent    History   Social History  . Marital Status: Married    Spouse Name: N/A    Number of Children: N/A  . Years of Education:  N/A   Social History Main Topics  . Smoking status: Never Smoker   . Smokeless tobacco: Never Used  . Alcohol Use: No  . Drug Use: No  . Sexual Activity: No   Other Topics Concern  . None   Social History Narrative   Work or School: homemaker      Home Situation: lives with husband who has parkinson and dementia - she is the sole caregiver      Spiritual Beliefs: christian      Lifestyle: was exercising until the summer - walks about 1/3 of a mile on a regular basis, only limited by knee pain, can walk of a flight of steps without any SOB             Current outpatient prescriptions:Ascorbic Acid (VITAMIN C PO), Take 1 tablet by mouth daily., Disp: , Rfl: ;  atenolol (TENORMIN) 25 MG tablet, Take 1 tablet (25 mg total) by mouth daily., Disp: 90 tablet, Rfl: 3;  calcium citrate (CALCITRATE - DOSED IN MG ELEMENTAL CALCIUM) 950 MG tablet, Take 1 tablet by mouth daily., Disp: , Rfl: ;  Cholecalciferol (VITAMIN D3) 5000 UNITS TABS, Take 1 tablet by mouth daily., Disp: ,  Rfl:  Coenzyme Q10 (CO Q 10) 100 MG CAPS, Take 1 capsule by mouth daily., Disp: , Rfl: ;  Glucosamine-Chondroitin-MSM (TRIPLE FLEX PO), Take 2 capsules by mouth daily., Disp: , Rfl: ;  hydrochlorothiazide (HYDRODIURIL) 25 MG tablet, TAKE ONE TABLET BY MOUTH ONCE DAILY, Disp: 90 tablet, Rfl: 3;  ibuprofen (ADVIL,MOTRIN) 200 MG tablet, Take 200 mg by mouth every 6 (six) hours as needed for pain., Disp: , Rfl:  meclizine (ANTIVERT) 25 MG tablet, Take 0.5 tablets (12.5 mg total) by mouth 3 (three) times daily as needed for dizziness., Disp: 30 tablet, Rfl: 1;  NONFORMULARY OR COMPOUNDED ITEM, Estradiol .02% 1 ML Prefilled Applicator Sig: apply vaginally twice a week #90 Day Supply with 4 refills, Disp: 1 each, Rfl: 4;  Nutritional Supplements (JUICE PLUS FIBRE PO), Take 2 tablets by mouth daily., Disp: , Rfl:   EXAM:  Filed Vitals:   02/14/14 1307  BP: 130/70  Pulse: 63  Temp: 98.3 F (36.8 C)    Body mass index is 27.06  kg/(m^2).  GENERAL: vitals reviewed and listed above, alert, oriented, appears well hydrated and in no acute distress  HEENT: atraumatic, conjunttiva clear, no obvious abnormalities on inspection of external nose and ears  NECK: no obvious masses on inspection  LUNGS: clear to auscultation bilaterally, no wheezes, rales or rhonchi, good air movement  CV: HRRR, no peripheral edema  MS: moves all extremities without noticeable abnormality  PSYCH: pleasant and cooperative, no obvious depression or anxiety  ASSESSMENT AND PLAN:  Discussed the following assessment and plan:  Hypertension - Plan: Basic metabolic panel  Hyperlipemia - Plan: Lipid panel  Prediabetes - Plan: Hemoglobin A1C  -Patient advised to return or notify a doctor immediately if symptoms worsen or persist or new concerns arise.  Patient Instructions  -schedule lab visit to come do fasting labs sometime in the next few weeks  -follow up in 4-6 months for you medicare wellness exam     Leslie Burns

## 2014-02-15 ENCOUNTER — Telehealth: Payer: Self-pay | Admitting: Family Medicine

## 2014-02-15 NOTE — Telephone Encounter (Signed)
Relevant patient education mailed to patient.  

## 2014-02-27 ENCOUNTER — Other Ambulatory Visit (INDEPENDENT_AMBULATORY_CARE_PROVIDER_SITE_OTHER): Payer: Medicare HMO

## 2014-02-27 DIAGNOSIS — R7303 Prediabetes: Secondary | ICD-10-CM

## 2014-02-27 DIAGNOSIS — E785 Hyperlipidemia, unspecified: Secondary | ICD-10-CM

## 2014-02-27 DIAGNOSIS — I10 Essential (primary) hypertension: Secondary | ICD-10-CM

## 2014-02-27 DIAGNOSIS — R7309 Other abnormal glucose: Secondary | ICD-10-CM

## 2014-02-27 LAB — LIPID PANEL
Cholesterol: 213 mg/dL — ABNORMAL HIGH (ref 0–200)
HDL: 48.2 mg/dL (ref >39.00)
LDL CALC: 147 mg/dL — AB (ref 0–99)
TRIGLYCERIDES: 91 mg/dL (ref 0.0–149.0)
Total CHOL/HDL Ratio: 4
VLDL: 18.2 mg/dL (ref 0.0–40.0)

## 2014-02-27 LAB — BASIC METABOLIC PANEL WITH GFR
BUN: 14 mg/dL (ref 6–23)
CO2: 30 meq/L (ref 19–32)
Calcium: 9 mg/dL (ref 8.4–10.5)
Chloride: 106 meq/L (ref 96–112)
Creatinine, Ser: 0.8 mg/dL (ref 0.4–1.2)
GFR: 79.19 mL/min
Glucose, Bld: 94 mg/dL (ref 70–99)
Potassium: 3.7 meq/L (ref 3.5–5.1)
Sodium: 142 meq/L (ref 135–145)

## 2014-02-27 LAB — HEMOGLOBIN A1C: Hgb A1c MFr Bld: 5.9 % (ref 4.6–6.5)

## 2014-04-26 IMAGING — US US SOFT TISSUE HEAD/NECK
1 series · 13 of 25 positions shown · non-contrast
Comparison: US SOFT TISSUE HEAD/NECK dated 12/27/2013

CLINICAL DATA: Multinodular enlargement of the thyroid gland. The
patient has been referred for thyroid biopsy.

EXAM:
THYROID ULTRASOUND
TECHNIQUE: Ultrasound examination of the thyroid gland and adjacent soft
tissues was performed.

[Series 1: us soft tissue head/neck · 0.17mm/px · 41 acquisitions, 13 frames shown]
[im 1/41]
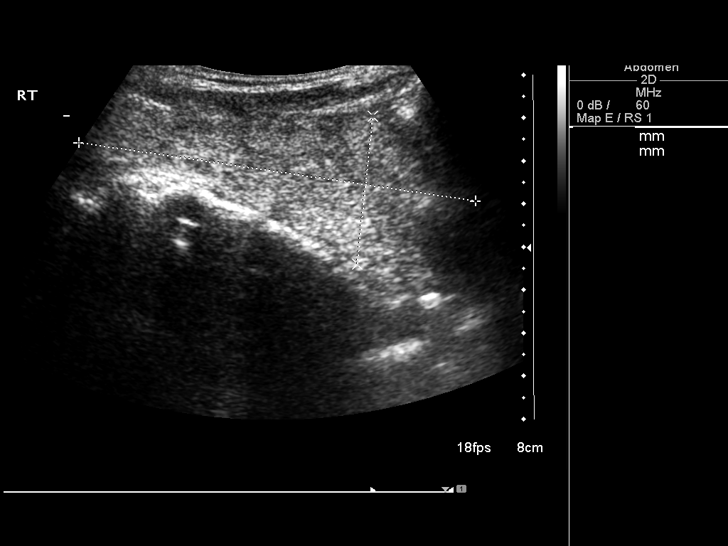
[im 4/41]
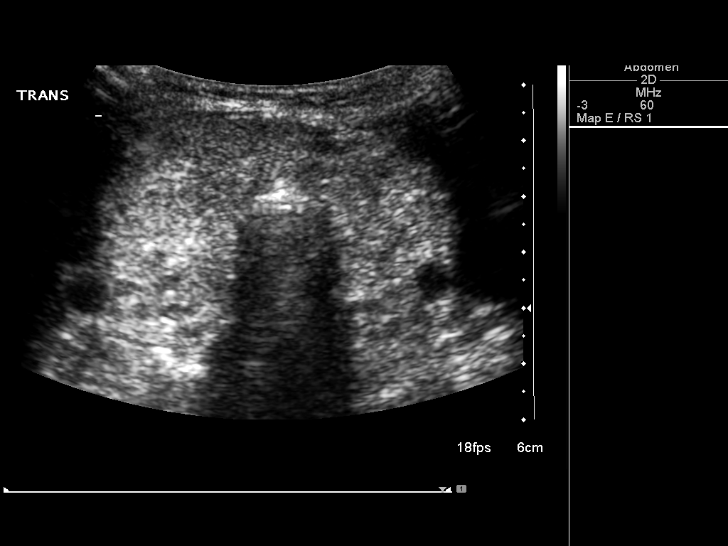
[im 7/41]
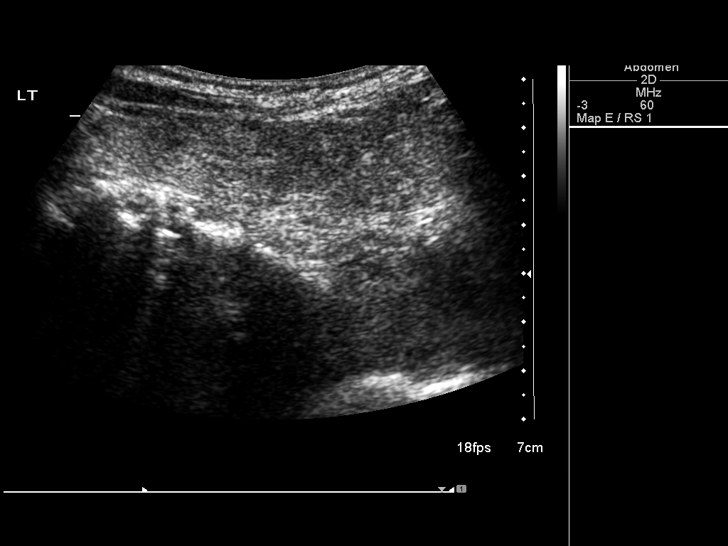
[im 11/41]
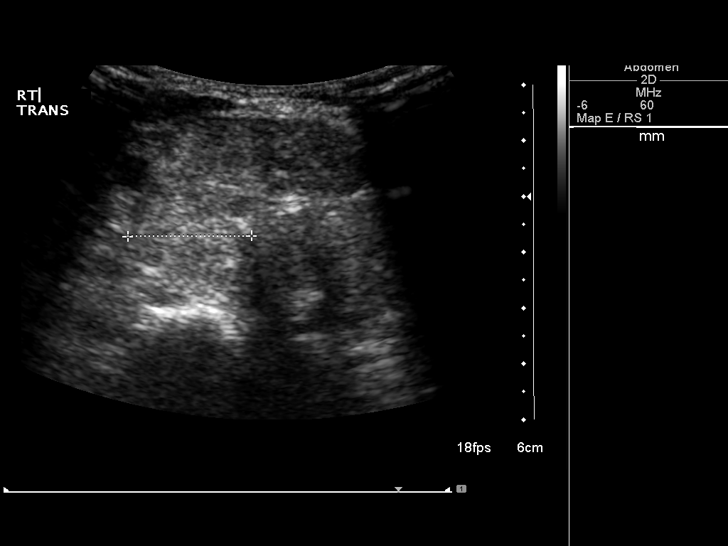
[im 14/41]
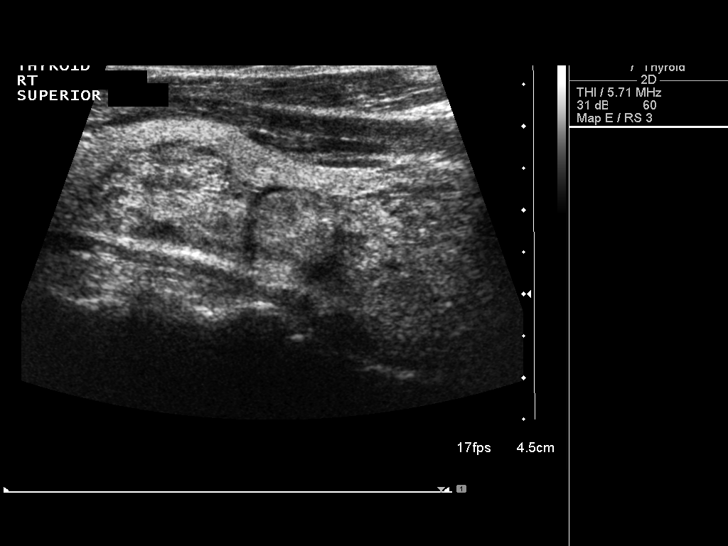
[im 17/41]
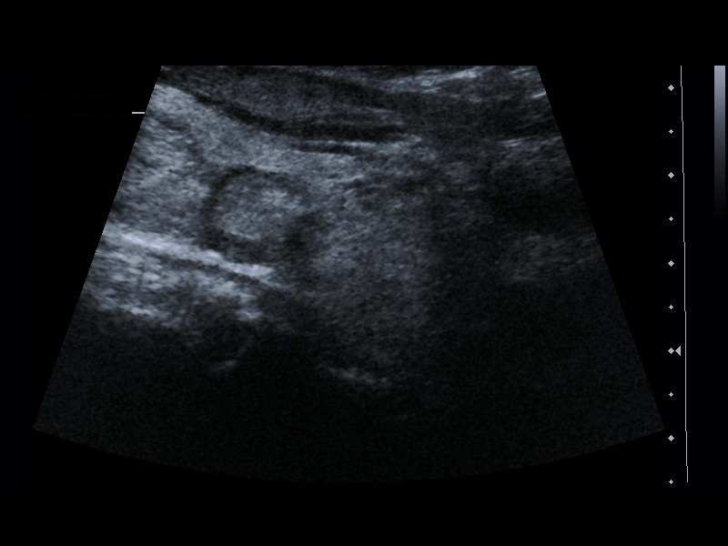
[im 21/41]
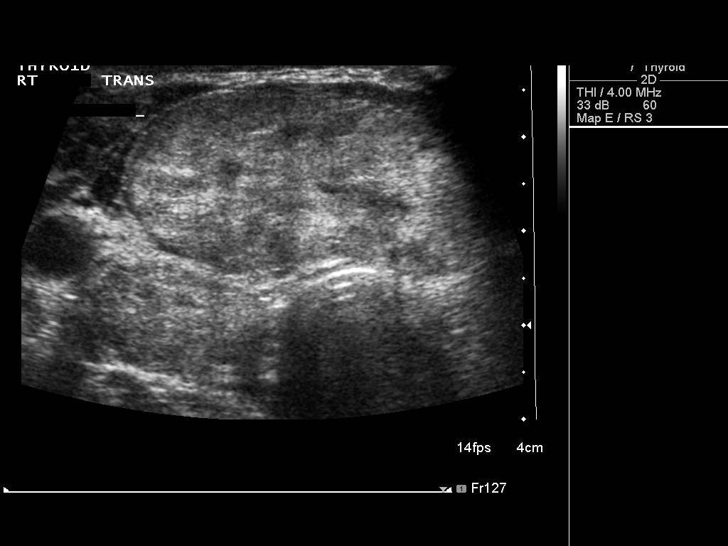
[im 24/41]
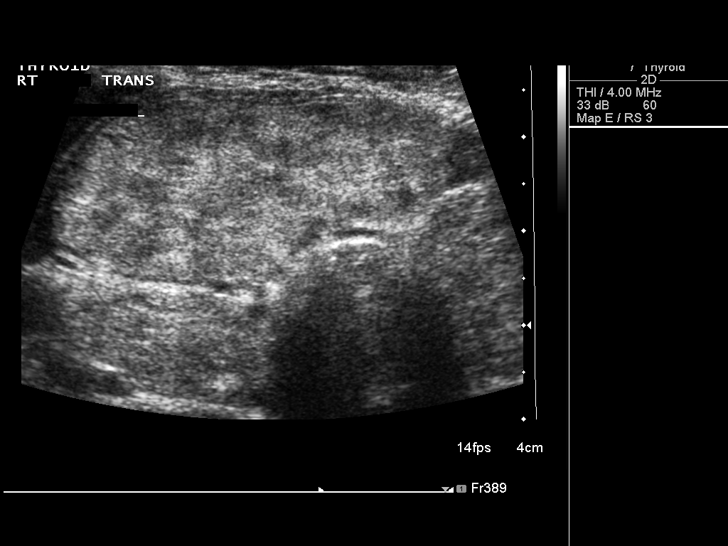
[im 27/41]
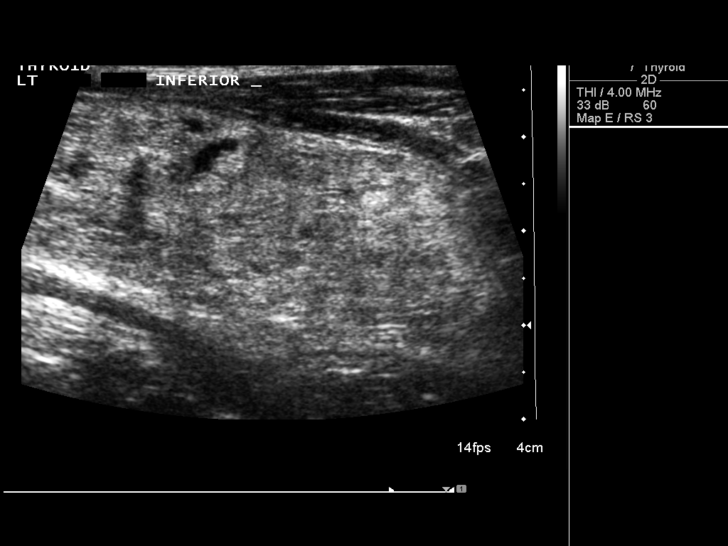
[im 31/41]
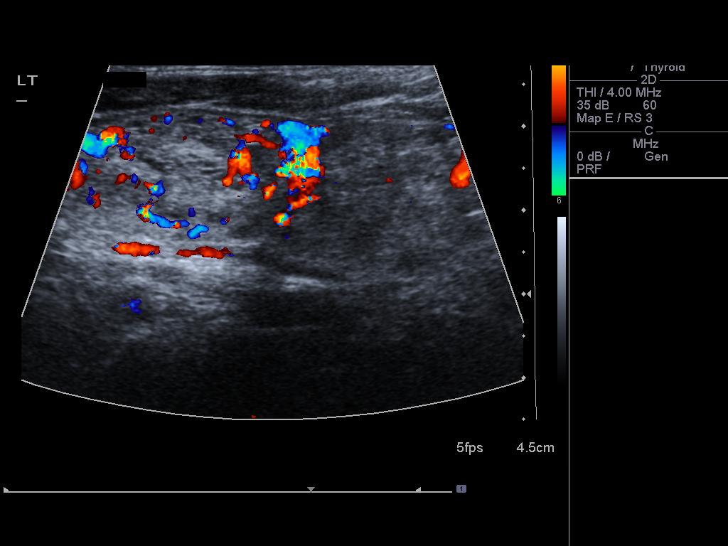
[im 34/41]
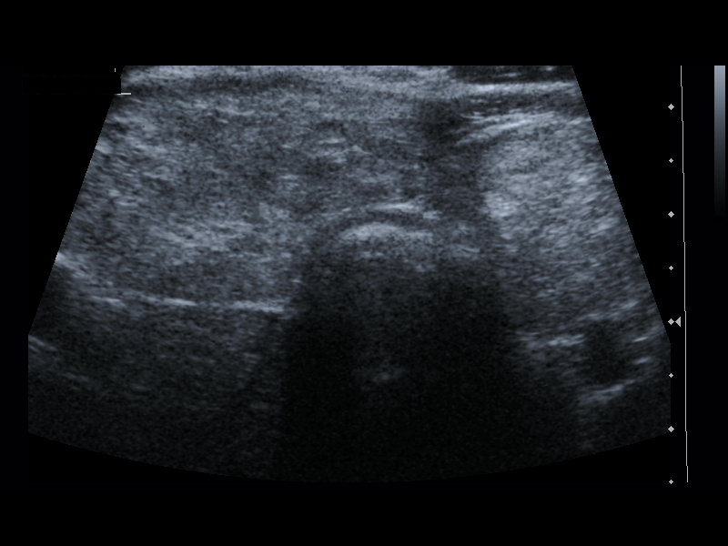
[im 37/41]
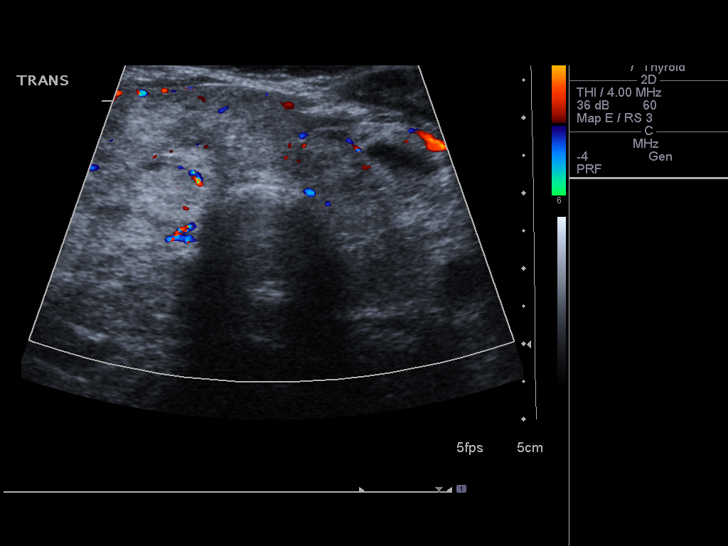
[im 41/41]
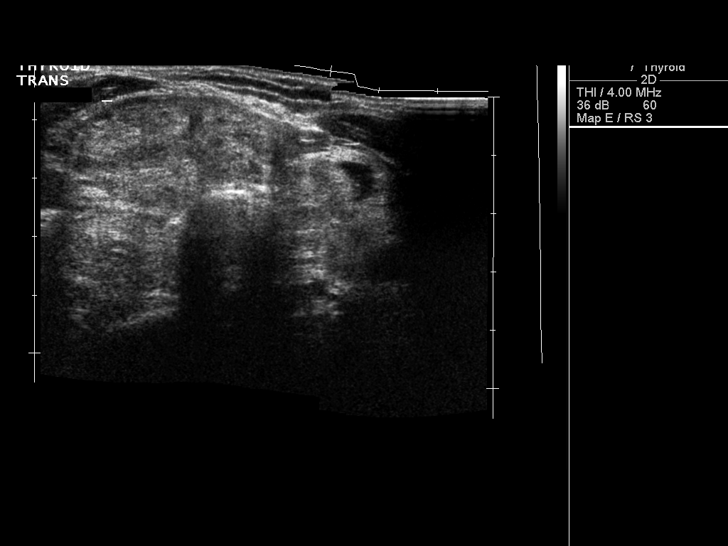

[13 of 25 positions shown; findings below may reference images not displayed]

FINDINGS: Careful evaluation of the thyroid gland was performed with
ultrasound prior to potential nodule biopsy. This included more
accurate lobe are measurements utilizing a for transducer as well as
additional linear transducer examination of the thyroid gland.
Overall dimensions of the thyroid gland are felt to be larger than
initially measured.

Right thyroid lobe

Measurements: 9.3 x 3.5 x 2.2 cm.

Left thyroid lobe

Measurements: 8.4 x 2.6 x 2.2 cm.

Isthmus

Thickness: 2.0 cm in maximal thickness.

The entire thyroid gland is diffusely enlarged and multinodular in
appearance. There are so many areas of potential measurable
nodularity that there is no way to target any more suspicious areas
for biopsy. Nodular areas are not discrete, indistinctly marginated
and blend into adjacent heterogeneous nodular tissue. Given
appearance of the thyroid gland by ultrasound today, biopsy was not
indicated. I would recommend both clinical and imaging follow-up to
evaluate for any gross changes in the thyroid gland. There are no
suspicious calcifications identified. Vascularity appears normal.
IMPRESSION: Moderately enlarged and diffusely nodular thyroid gland with no
dominant suspicious areas of nodularity identified. Given the
diffusely indistinct appearance of the enlarged gland with multiple
confluent nodular areas, there is no way to target a nodule or
nodules for biopsy. Recommend clinical and imaging follow-up by
ultrasound, as indicated.

## 2014-06-06 ENCOUNTER — Other Ambulatory Visit: Payer: Self-pay | Admitting: Family Medicine

## 2014-06-28 ENCOUNTER — Other Ambulatory Visit: Payer: Self-pay | Admitting: Family Medicine

## 2014-08-13 ENCOUNTER — Other Ambulatory Visit: Payer: Self-pay | Admitting: Family Medicine

## 2014-08-16 ENCOUNTER — Encounter: Payer: Self-pay | Admitting: Family Medicine

## 2014-08-16 ENCOUNTER — Ambulatory Visit (INDEPENDENT_AMBULATORY_CARE_PROVIDER_SITE_OTHER): Payer: Medicare HMO | Admitting: Family Medicine

## 2014-08-16 VITALS — BP 126/72 | HR 58 | Temp 97.5°F | Ht 66.75 in | Wt 165.5 lb

## 2014-08-16 DIAGNOSIS — M1711 Unilateral primary osteoarthritis, right knee: Secondary | ICD-10-CM

## 2014-08-16 DIAGNOSIS — G43809 Other migraine, not intractable, without status migrainosus: Secondary | ICD-10-CM

## 2014-08-16 DIAGNOSIS — G43009 Migraine without aura, not intractable, without status migrainosus: Secondary | ICD-10-CM

## 2014-08-16 DIAGNOSIS — L989 Disorder of the skin and subcutaneous tissue, unspecified: Secondary | ICD-10-CM

## 2014-08-16 DIAGNOSIS — E049 Nontoxic goiter, unspecified: Secondary | ICD-10-CM

## 2014-08-16 DIAGNOSIS — I493 Ventricular premature depolarization: Secondary | ICD-10-CM

## 2014-08-16 DIAGNOSIS — E785 Hyperlipidemia, unspecified: Secondary | ICD-10-CM

## 2014-08-16 DIAGNOSIS — R2231 Localized swelling, mass and lump, right upper limb: Secondary | ICD-10-CM

## 2014-08-16 DIAGNOSIS — M179 Osteoarthritis of knee, unspecified: Secondary | ICD-10-CM

## 2014-08-16 DIAGNOSIS — Z0000 Encounter for general adult medical examination without abnormal findings: Secondary | ICD-10-CM

## 2014-08-16 DIAGNOSIS — I10 Essential (primary) hypertension: Secondary | ICD-10-CM

## 2014-08-16 HISTORY — DX: Migraine without aura, not intractable, without status migrainosus: G43.009

## 2014-08-16 MED ORDER — ONDANSETRON 4 MG PO TBDP: 4.0000 mg | ORAL_TABLET | Freq: Three times a day (TID) | ORAL | Status: DC | PRN

## 2014-08-16 NOTE — Progress Notes (Signed)
Pre visit review using our clinic review tool, if applicable. No additional management support is needed unless otherwise documented below in the visit note. 

## 2014-08-16 NOTE — Progress Notes (Signed)
YO:VZCHYIFO Annual Preventive Care Visit and multiple new and old problems to discuss (initial annual wellness or annual wellness exam) She doesn't think she ever had a medicare wellness exam  Concerns and/or follow up today - she has several new problems:  Vertigo - told atypical migraine: -dx 25 years ago -intermittent - maybe a few episodes per year -takes meclizine and anti-emetic - wants ODT as can't keep down any pills for nausea  Cyst on R thumb: -started a few months ago, getting bigger, discomfort but not pain -no limitation in ROM, desires removal  Non-healing skin lesion Nose: -for months, no bleeding or pain or itching  HTN/PVCs/SVT:  -on atenolol, hctz  -denies: CP, SOB, swelling   HLD/Prediabetes:  -last check in May 2015 - LDL elevated but ratio ok and she refuses medication -denies: polyuria, polydipsia, vision changes  Thyroid Goiter/Elevated TSH/Normal free T4:  -evaluated by [facility] 12/3013, thought needed biopsy, then when went for biopsy apparently Dr. Darryl Nestle felt did not need biopsy and advised yearly [place] and TSH -reports no swelling of throat, hot/or cold flashes, fatigue, malaise    OA knees:  -on clucosmine-chondrointin  ROS: negative for report of fevers, unintentional weight loss, vision changes, vision loss, hearing loss or change, chest pain, sob, hemoptysis, melena, hematochezia, hematuria, genital discharge or lesions, falls, bleeding or bruising, loc, thoughts of suicide or self harm, memory loss  1.) Patient-completed health risk assessment  - completed and reviewed, see scanned documentation  2.) Review of Medical History: -PMH, PSH, Family History and current specialty and care providers reviewed and updated and listed below  - see scanned in document in chart and below  Past Medical History  Diagnosis Date  . Chicken pox   . Seasonal allergies   . Hypertension     high readings  . High cholesterol   . Ovarian cyst 2007  .  Vertigo   . SVT (supraventricular tachycardia)   . Neuromuscular disorder     numbness&tingling arms &hands  . PONV (postoperative nausea and vomiting)     everytime, always sick with anesth.   . Arthritis     "both knees" (01/22/2013)  . Basal cell carcinoma of back ` 1980's  . Cystocele or rectocele with uterine prolapse 09/05/2013  . Atypical migraine with veritgo 08/16/2014    diagnosed >25 years ago per her report, treats with meclizine and anti-emetic - suppository or odt   . Enlarged thyroid, s/p eval by ENT in 11/2013, no nodule required biopsy and annual [place] and TSH advised 01/16/2014    Past Surgical History  Procedure Laterality Date  . Tonsillectomy and adenoidectomy  1945  . Combined hysteroscopy diagnostic / d&c  1958  . Combined hysteroscopy diagnostic / d&c  1972    tubal pregnancy  . Cyst excision      ovarian  . Total knee arthroplasty Right 01/22/2013  . Dilation and curettage of uterus    . Cataract extraction w/ intraocular lens  implant, bilateral Bilateral 2012  . Laparoscopic ovarian cystectomy  ~ 2006  . Av node ablation  1995  . Basal cell carcinoma excision  ?1980's    "off my back" (01/22/2013)  . Total knee arthroplasty Right 01/22/2013    Procedure: TOTAL KNEE ARTHROPLASTY;  Surgeon: Vickey Huger, MD;  Location: [facility];  Service: Orthopedics;  Laterality: Right;    History   Social History  . Marital Status: Married    Spouse Name: N/A    Number of Children: N/A  .  Years of Education: N/A   Occupational History  . Not on file.   Social History Main Topics  . Smoking status: Never Smoker   . Smokeless tobacco: Never Used  . Alcohol Use: No  . Drug Use: No  . Sexual Activity: No   Other Topics Concern  . Not on file   Social History Narrative   Work or School: homemaker      Home Situation: lives with husband who has parkinson and dementia - she is the sole caregiver      Spiritual Beliefs: christian      Lifestyle: was exercising until  the summer - walks about 1/3 of a mile on a regular basis, only limited by knee pain, can walk of a flight of steps without any SOB             The patient has a family history of  3.) Review of functional ability and level of safety:  Any difficulty hearing?  NO  History of falling? NO  Any trouble with IADLs - using a phone, using transportation, grocery shopping, preparing meals, doing housework, doing laundry, taking medications and managing money?  NO  Advance Directives? YES   See summary of recommendations in Patient Instructions below.  4.) Physical Exam Filed Vitals:   08/16/14 1122  BP: 126/72  Pulse: 58  Temp: 97.5 F (36.4 C)   Estimated body mass index is 26.13 kg/(m^2) as calculated from the following:   Height as of this encounter: 5' 6.75" (1.695 m).   Weight as of this encounter: 165 lb 8 oz (75.07 kg).  EKG (optional): deferred  General: alert, appear well hydrated and in no acute distress  HEENT: visual acuity grossly intact  CV: HRRR  SKIN: AKs nose  MS: soft, cyst ext surface R thumb  Lungs: CTA bilaterally  Psych: pleasant and cooperative, no obvious depression or anxiety  Mini Cog: 1. Patient instructed to listen carefully and repeat the following: [place]  2. Clock drawing test was administered: NORMAL       3. Recall of three words  Scoring:  NEG  See patient instructions for recommendations.  Education and counseling regarding the above review of health provided with a plan for the following: -see scanned patient completed form for further details -fall prevention strategies discussed  -healthy lifestyle discussed -importance and resources for completing advanced directives discussed -see patient instructions below for any other recommendations provided  4)The following written screening schedule of preventive measures were reviewed with assessment and plan made per below, orders and patient  instructions:      AAA screening:n/a     Alcohol screeningdone     Obesity Screening and counseling: done     STI screening: done     Tobacco Screening: done       Pneumococcal (PPSV23 -one dose after 64, one before if risk factors), influenza yearly and hepatitis B vaccines (if high risk - end stage renal disease, IV drugs, homosexual men, live in home for mentally retarded, hemophilia receiving factors) ASSESSMENT/PLAN:declined, declines all vaccines      Screening mammograph (yearly if >40) ASSESSMENT/PLAN: declined      Screening Pap smear/pelvic exam (q2 years) ASSESSMENT/PLAN: n/a      Prostate cancer screening ASSESSMENT/PLAN:n/a      Colorectal cancer screening (FOBT yearly or flex sig q4y or colonoscopy q10y or barium enema q4y) ASSESSMENT/PLAN:      Diabetes outpatient self-management training services ASSESSMENT/PLAN:  Bone mass measurements(covered q2y if indicated - estrogen def, osteoporosis, hyperparathyroid, vertebral abnormalities, osteoporosis or steroids) ASSESSMENT/PLAN: declined      Screening for glaucoma(q1y if high risk - diabetes, FH, AA and > 50 or hispanic and > 65) ASSESSMENT/PLAN: sees eye doctor      Medical nutritional therapy for individuals with diabetes or renal disease ASSESSMENT/PLAN: n/a      Cardiovascular screening blood tests (lipids q5y) ASSESSMENT/PLAN: done      Diabetes screening tests ASSESSMENT/PLAN: done   7.) Summary: -risk factors and conditions per above assessment were discussed and treatment, recommendations and referrals were offered per documentation above and orders and patient instructions.  Initial Medicare annual wellness visit  Atypical migraine with veritgo - diagnosed >25 years ago per her report, treats with meclizine and anti-emetic - suppository or odt - Plan: ondansetron (ZOFRAN ODT) 4 MG disintegrating tablet  Finger mass, right - Plan: Ambulatory referral to Orthopedic Surgery  Non-healing skin  lesion of nose -she is going to see dermatologist for this  Hyperlipemia -declines tx, ok to check in March when checks thyroid  Essential hypertension  Osteoarthritis of right knee, unspecified osteoarthritis type  PVC   and PAC s -stable  Enlarged thyroid, s/p eval by ENT in 11/2013, no nodule required biopsy and annual [place] and TSH advised -advised [place] and labs in feb or march, eval with endocrine if changing, abnormal labs -she is reluctant to even recheck as reports told it was nothing when went for biopsy  Patient Instructions  For the skin issues on the nose - call the dermatologist to schedule appointment.  -We placed a referral for you as discussed. It usually takes about 1-2 weeks to process and schedule this referral. If you have not heard from [place] regarding this appointment in 2 weeks please contact our office.  We recommend the following healthy lifestyle measures: - eat a healthy diet consisting of lots of vegetables, fruits, beans, nuts, seeds, healthy meats such as white chicken and fish and whole grains.  - avoid fried foods, fast food, processed foods, sodas, red meet and other fattening foods.  - get a least 150 minutes of aerobic exercise per week.   Follow up in March for a morning appointment and we will do labs that day - so come fasting

## 2014-08-16 NOTE — Patient Instructions (Signed)
For the skin issues on the nose - call the dermatologist to schedule appointment.  -We placed a referral for you as discussed. It usually takes about 1-2 weeks to process and schedule this referral. If you have not heard from [place] regarding this appointment in 2 weeks please contact our office.  We recommend the following healthy lifestyle measures: - eat a healthy diet consisting of lots of vegetables, fruits, beans, nuts, seeds, healthy meats such as white chicken and fish and whole grains.  - avoid fried foods, fast food, processed foods, sodas, red meet and other fattening foods.  - get a least 150 minutes of aerobic exercise per week.   Follow up in March for a morning appointment and we will do labs that day - so come fasting

## 2014-08-26 ENCOUNTER — Encounter: Payer: Self-pay | Admitting: Family Medicine

## 2014-09-06 ENCOUNTER — Ambulatory Visit (INDEPENDENT_AMBULATORY_CARE_PROVIDER_SITE_OTHER): Payer: Medicare HMO | Admitting: Gynecology

## 2014-09-06 ENCOUNTER — Encounter: Payer: Self-pay | Admitting: Gynecology

## 2014-09-06 VITALS — BP 140/86 | Ht 66.0 in | Wt 165.0 lb

## 2014-09-06 DIAGNOSIS — N8110 Cystocele, unspecified: Secondary | ICD-10-CM

## 2014-09-06 DIAGNOSIS — N816 Rectocele: Secondary | ICD-10-CM | POA: Insufficient documentation

## 2014-09-06 DIAGNOSIS — N952 Postmenopausal atrophic vaginitis: Secondary | ICD-10-CM | POA: Insufficient documentation

## 2014-09-06 DIAGNOSIS — Z4689 Encounter for fitting and adjustment of other specified devices: Secondary | ICD-10-CM

## 2014-09-06 DIAGNOSIS — N814 Uterovaginal prolapse, unspecified: Secondary | ICD-10-CM | POA: Insufficient documentation

## 2014-09-06 DIAGNOSIS — IMO0002 Reserved for concepts with insufficient information to code with codable children: Secondary | ICD-10-CM | POA: Insufficient documentation

## 2014-09-06 DIAGNOSIS — Z780 Asymptomatic menopausal state: Secondary | ICD-10-CM

## 2014-09-06 MED ORDER — NONFORMULARY OR COMPOUNDED ITEM
Status: DC
Start: 2014-09-06 — End: 2015-09-08

## 2014-09-06 NOTE — Progress Notes (Addendum)
Leslie Burns 19-Feb-1933 [id number]   History:    80 y.o.  for GYN exam and follow-up. Patient was seen last year with complaints of vaginal bulging and discomfort with a ring pessary that was placed by her previous provider.She has not been interested any surgical intervention as a result of an ailing husband who is requiring her around-the-clock care. We switched her to a different Ghelhorn Pessary. : At times is a little discomfort for her removing. She states that when she pulls it out she does not reinserted for several days and then reinserted and changes it weekly when needed. This appears to have worked so far. She had previously been referred to the urologist for consultation but patient has opted to hold off on any surgery now. Her PCP is Dr. Maudie Mercury who recently has evaluated her with ultrasound scans of her neck for multinodular goiter and had been referred to the endocrinologist for which she will follow up this March. Patient has declined colonoscopy. She does not recall when she has had a bone density study. Her last mammogram was several years ago. She also declined flu vaccine and the shingles vaccine.patient with no past history of abnormal Pap smears.  Past medical history,surgical history, family history and social history were all reviewed and documented in the EPIC chart.  Gynecologic History No LMP recorded. Patient is postmenopausal. Contraception: post menopausal status Last Pap: several years ago. Results were: normal Last mammogram: many years ago. Results were: normal  Obstetric History OB History  Gravida Para Term Preterm AB SAB TAB Ectopic Multiple Living  10 8   2 2    8     # Outcome Date GA Lbr Len/2nd Weight Sex Delivery Anes PTL Lv  10 SAB           9 SAB           8 Para           7 Para           6 Para           5 Para           4 Para           3 Para           2 Para           1 Para                ROS: A ROS was performed and pertinent  positives and negatives are included in the history.  GENERAL: No fevers or chills. HEENT: No change in vision, no earache, sore throat or sinus congestion. NECK: No pain or stiffness. CARDIOVASCULAR: No chest pain or pressure. No palpitations. PULMONARY: No shortness of breath, cough or wheeze. GASTROINTESTINAL: No abdominal pain, nausea, vomiting or diarrhea, melena or bright red blood per rectum. GENITOURINARY: No urinary frequency, urgency, hesitancy or dysuria. MUSCULOSKELETAL: No joint or muscle pain, no back pain, no recent trauma. DERMATOLOGIC: No rash, no itching, no lesions. ENDOCRINE: No polyuria, polydipsia, no heat or cold intolerance. No recent change in weight. HEMATOLOGICAL: No anemia or easy bruising or bleeding. NEUROLOGIC: No headache, seizures, numbness, tingling or weakness. PSYCHIATRIC: No depression, no loss of interest in normal activity or change in sleep pattern.     Exam: chaperone present  BP 140/86 mmHg  Ht 5\' 6"  (1.676 m)  Wt 165 lb (74.844 kg)  BMI 26.64 kg/m2  Body mass index is 26.64 kg/(m^2).  General appearance : Well developed well nourished female. No acute distress HEENT: Neck supple, trachea midline, no carotid bruits, no thyroidmegaly Lungs: Clear to auscultation, no rhonchi or wheezes, or rib retractions  Heart: Regular rate and rhythm, no murmurs or gallops Breast:Examined in sitting and supine position were symmetrical in appearance, no palpable masses or tenderness,  no skin retraction, no nipple inversion, no nipple discharge, no skin discoloration, no axillary or supraclavicular lymphadenopathy Abdomen: no palpable masses or tenderness, no rebound or guarding Extremities: no edema or skin discoloration or tenderness  Pelvic: Bartholin's urethra Skene glands with atrophic changes Vagina the ring pessary was removed and on Valsalva patient with a protruding cystocele although the cervix appears distended mildly.There was a mild first degree  rectocele. Patient was examined in the supine and erect position I do not feel an enterocele.     Assessment/Plan:  80 y.o. female for annual exam postmenopausal patient with uterine prolapse with first degree cystocele and rectocele using Gellhorn pessary for support has done well. Prescription refill for estrogen cream to apply twice a week. No Pap smear done according to the new guidelines as well as mammogram. Patient will be scheduled for bone density study here in the office. Patient declined colonoscopy, flu vaccine, and shingles vaccine. We discussed importance of calcium vitamin D and regular exercise for osteoporosis prevention.   Terrance Mass MD, 12:31 PM 09/06/2014

## 2014-09-26 ENCOUNTER — Ambulatory Visit (INDEPENDENT_AMBULATORY_CARE_PROVIDER_SITE_OTHER): Payer: Medicare HMO

## 2014-09-26 ENCOUNTER — Other Ambulatory Visit: Payer: Self-pay | Admitting: Gynecology

## 2014-09-26 DIAGNOSIS — M8580 Other specified disorders of bone density and structure, unspecified site: Secondary | ICD-10-CM

## 2014-09-26 DIAGNOSIS — Z780 Asymptomatic menopausal state: Secondary | ICD-10-CM

## 2014-09-26 DIAGNOSIS — M899 Disorder of bone, unspecified: Secondary | ICD-10-CM

## 2014-10-02 ENCOUNTER — Other Ambulatory Visit: Payer: Self-pay | Admitting: *Deleted

## 2014-10-02 ENCOUNTER — Other Ambulatory Visit: Payer: Self-pay | Admitting: Family Medicine

## 2014-10-02 DIAGNOSIS — M898X9 Other specified disorders of bone, unspecified site: Secondary | ICD-10-CM

## 2014-10-02 DIAGNOSIS — M8580 Other specified disorders of bone density and structure, unspecified site: Secondary | ICD-10-CM

## 2014-10-04 ENCOUNTER — Other Ambulatory Visit: Payer: Medicare HMO

## 2014-10-04 DIAGNOSIS — M898X9 Other specified disorders of bone, unspecified site: Secondary | ICD-10-CM

## 2014-10-04 DIAGNOSIS — M8580 Other specified disorders of bone density and structure, unspecified site: Secondary | ICD-10-CM

## 2014-10-05 LAB — VITAMIN D 25 HYDROXY (VIT D DEFICIENCY, FRACTURES): Vit D, 25-Hydroxy: 69 ng/mL (ref 30–100)

## 2014-10-07 LAB — PTH, INTACT AND CALCIUM
Calcium: 9.4 mg/dL (ref 8.4–10.5)
PTH: 33 pg/mL (ref 14–64)

## 2014-10-14 ENCOUNTER — Encounter: Payer: Self-pay | Admitting: Gynecology

## 2014-10-14 ENCOUNTER — Ambulatory Visit (INDEPENDENT_AMBULATORY_CARE_PROVIDER_SITE_OTHER): Payer: Medicare HMO | Admitting: Gynecology

## 2014-10-14 VITALS — BP 130/74

## 2014-10-14 DIAGNOSIS — M8580 Other specified disorders of bone density and structure, unspecified site: Secondary | ICD-10-CM | POA: Insufficient documentation

## 2014-10-14 NOTE — Patient Instructions (Signed)
Alendronate tablets What is this medicine? ALENDRONATE (a LEN droe nate) slows calcium loss from bones. It helps to make normal healthy bone and to slow bone loss in people with Paget's disease and osteoporosis. It may be used in others at risk for bone loss. This medicine may be used for other purposes; ask your health care provider or pharmacist if you have questions. COMMON BRAND NAME(S): Fosamax What should I tell my health care provider before I take this medicine? They need to know if you have any of these conditions: -dental disease -esophagus, stomach, or intestine problems, like acid reflux or GERD -kidney disease -low blood calcium -low vitamin D -problems sitting or standing 30 minutes -trouble swallowing -an unusual or allergic reaction to alendronate, other medicines, foods, dyes, or preservatives -pregnant or trying to get pregnant -breast-feeding How should I use this medicine? You must take this medicine exactly as directed or you will lower the amount of the medicine you absorb into your body or you may cause yourself harm. Take this medicine by mouth first thing in the morning, after you are up for the day. Do not eat or drink anything before you take your medicine. Swallow the tablet with a full glass (6 to 8 fluid ounces) of plain water. Do not take this medicine with any other drink. Do not chew or crush the tablet. After taking this medicine, do not eat breakfast, drink, or take any medicines or vitamins for at least 30 minutes. Sit or stand up for at least 30 minutes after you take this medicine; do not lie down. Do not take your medicine more often than directed. Talk to your pediatrician regarding the use of this medicine in children. Special care may be needed. Overdosage: If you think you have taken too much of this medicine contact a poison control center or emergency room at once. NOTE: This medicine is only for you. Do not share this medicine with others. What if I  miss a dose? If you miss a dose, do not take it later in the day. Continue your normal schedule starting the next morning. Do not take double or extra doses. What may interact with this medicine? -aluminum hydroxide -antacids -aspirin -calcium supplements -drugs for inflammation like ibuprofen, naproxen, and others -iron supplements -magnesium supplements -vitamins with minerals This list may not describe all possible interactions. Give your health care provider a list of all the medicines, herbs, non-prescription drugs, or dietary supplements you use. Also tell them if you smoke, drink alcohol, or use illegal drugs. Some items may interact with your medicine. What should I watch for while using this medicine? Visit your doctor or health care professional for regular checks ups. It may be some time before you see benefit from this medicine. Do not stop taking your medicine except on your doctor's advice. Your doctor or health care professional may order blood tests and other tests to see how you are doing. You should make sure you get enough calcium and vitamin D while you are taking this medicine, unless your doctor tells you not to. Discuss the foods you eat and the vitamins you take with your health care professional. Some people who take this medicine have severe bone, joint, and/or muscle pain. This medicine may also increase your risk for a broken thigh bone. Tell your doctor right away if you have pain in your upper leg or groin. Tell your doctor if you have any pain that does not go away or that gets worse.  This medicine can make you more sensitive to the sun. If you get a rash while taking this medicine, sunlight may cause the rash to get worse. Keep out of the sun. If you cannot avoid being in the sun, wear protective clothing and use sunscreen. Do not use sun lamps or tanning beds/booths. What side effects may I notice from receiving this medicine? Side effects that you should report to  your doctor or health care professional as soon as possible: -allergic reactions like skin rash, itching or hives, swelling of the face, lips, or tongue -black or tarry stools -bone, muscle or joint pain -changes in vision -chest pain -heartburn or stomach pain -jaw pain, especially after dental work -pain or trouble when swallowing -redness, blistering, peeling or loosening of the skin, including inside the mouth Side effects that usually do not require medical attention (report to your doctor or health care professional if they continue or are bothersome): -changes in taste -diarrhea or constipation -eye pain or itching -headache -nausea or vomiting -stomach gas or fullness This list may not describe all possible side effects. Call your doctor for medical advice about side effects. You may report side effects to FDA at 1-800-FDA-1088. Where should I keep my medicine? Keep out of the reach of children. Store at room temperature of 15 and 30 degrees C (59 and 86 degrees F). Throw away any unused medicine after the expiration date. NOTE: This sheet is a summary. It may not cover all possible information. If you have questions about this medicine, talk to your doctor, pharmacist, or health care provider.  2015, Elsevier/Gold Standard. (2011-04-09 08:56:09) Osteoporosis Throughout your life, your body breaks down old bone and replaces it with new bone. As you get older, your body does not replace bone as quickly as it breaks it down. By the age of 3 years, most people begin to gradually lose bone because of the imbalance between bone loss and replacement. Some people lose more bone than others. Bone loss beyond a specified normal degree is considered osteoporosis.  Osteoporosis affects the strength and durability of your bones. The inside of the ends of your bones and your flat bones, like the bones of your pelvis, look like honeycomb, filled with tiny open spaces. As bone loss occurs, your  bones become less dense. This means that the open spaces inside your bones become bigger and the walls between these spaces become thinner. This makes your bones weaker. Bones of a person with osteoporosis can become so weak that they can break (fracture) during minor accidents, such as a simple fall. CAUSES  The following factors have been associated with the development of osteoporosis:  Smoking.  Drinking more than 2 alcoholic drinks several days per week.  Long-term use of certain medicines:  Corticosteroids.  Chemotherapy medicines.  Thyroid medicines.  Antiepileptic medicines.  Gonadal hormone suppression medicine.  Immunosuppression medicine.  Being underweight.  Lack of physical activity.  Lack of exposure to the sun. This can lead to vitamin D deficiency.  Certain medical conditions:  Certain inflammatory bowel diseases, such as Crohn disease and ulcerative colitis.  Diabetes.  Hyperthyroidism.  Hyperparathyroidism. RISK FACTORS Anyone can develop osteoporosis. However, the following factors can increase your risk of developing osteoporosis:  Gender--Women are at higher risk than men.  Age--Being older than 50 years increases your risk.  Ethnicity--White and Asian people have an increased risk.  Weight --Being extremely underweight can increase your risk of osteoporosis.  Family history of osteoporosis--Having a family member  who has developed osteoporosis can increase your risk. SYMPTOMS  Usually, people with osteoporosis have no symptoms.  DIAGNOSIS  Signs during a physical exam that may prompt your caregiver to suspect osteoporosis include:  Decreased height. This is usually caused by the compression of the bones that form your spine (vertebrae) because they have weakened and become fractured.  A curving or rounding of the upper back (kyphosis). To confirm signs of osteoporosis, your caregiver may request a procedure that uses 2 low-dose X-ray  beams with different levels of energy to measure your bone mineral density (dual-energy X-ray absorptiometry [DXA]). Also, your caregiver may check your level of vitamin D. TREATMENT  The goal of osteoporosis treatment is to strengthen bones in order to decrease the risk of bone fractures. There are different types of medicines available to help achieve this goal. Some of these medicines work by slowing the processes of bone loss. Some medicines work by increasing bone density. Treatment also involves making sure that your levels of calcium and vitamin D are adequate. PREVENTION  There are things you can do to help prevent osteoporosis. Adequate intake of calcium and vitamin D can help you achieve optimal bone mineral density. Regular exercise can also help, especially resistance and weight-bearing activities. If you smoke, quitting smoking is an important part of osteoporosis prevention. MAKE SURE YOU:  Understand these instructions.  Will watch your condition.  Will get help right away if you are not doing well or get worse. FOR MORE INFORMATION www.osteo.org and EquipmentWeekly.com.ee Document Released: 07/21/2005 Document Revised: 02/05/2013 Document Reviewed: 09/25/2011 [facility] Patient Information 2015 [place], [place]. This information is not intended to replace advice given to you by your health care provider. Make sure you discuss any questions you have with your health care provider.

## 2014-10-14 NOTE — Progress Notes (Signed)
   Patient is a 80 year old who presented to the office today to discuss her bone density study. Patient was seen recently for her annual gynecological exam last month see previous note for specific details.into been numerous years when patient had her bone density study and it was in another facility and we had no records.  Bone density study done in our office last month demonstrated the following: Right hip T score was -1.1 with a Frax analysis indicating a 9.6% 10 year fracture risk exceeding the 3% threshold cut off. Her risk of any major osteoporotic fracture in the next 10 years based on Frax analysis was 19% almost at cut off.  We had a lengthy discussion that based on her T score being borderline osteopenic -1.1 that if she wanted to wait and reassess in 2 years it would be reasonable although because of her age and her family history of osteoporotic fracture it would not be unreasonable based on Frax analysis to start her on antiresorptive agent. We will through detailed discussion of the risks benefits and pros and cons of anti-resorptive agents and patient would like to wait and work on her weightbearing exercises and continue to take her calcium and vitamin D.we had ordered a calcium, vitamin D and PTH level which was drawn here in our office on December 11 and results were normal. Patient was provided with literature information on all the above topics discussed above.

## 2014-11-22 ENCOUNTER — Other Ambulatory Visit: Payer: Self-pay | Admitting: Family Medicine

## 2015-01-04 ENCOUNTER — Other Ambulatory Visit: Payer: Self-pay | Admitting: Family Medicine

## 2015-01-15 ENCOUNTER — Ambulatory Visit (INDEPENDENT_AMBULATORY_CARE_PROVIDER_SITE_OTHER): Payer: Medicare HMO | Admitting: Family Medicine

## 2015-01-15 DIAGNOSIS — R69 Illness, unspecified: Secondary | ICD-10-CM

## 2015-01-15 NOTE — Progress Notes (Signed)
NO SHOW

## 2015-01-23 ENCOUNTER — Encounter: Payer: Self-pay | Admitting: Family Medicine

## 2015-01-23 ENCOUNTER — Ambulatory Visit (INDEPENDENT_AMBULATORY_CARE_PROVIDER_SITE_OTHER): Payer: PPO | Admitting: Family Medicine

## 2015-01-23 VITALS — BP 128/70 | HR 62 | Temp 97.8°F | Ht 66.0 in | Wt 165.6 lb

## 2015-01-23 DIAGNOSIS — I10 Essential (primary) hypertension: Secondary | ICD-10-CM | POA: Diagnosis not present

## 2015-01-23 DIAGNOSIS — E049 Nontoxic goiter, unspecified: Secondary | ICD-10-CM | POA: Diagnosis not present

## 2015-01-23 DIAGNOSIS — R7309 Other abnormal glucose: Secondary | ICD-10-CM

## 2015-01-23 DIAGNOSIS — E785 Hyperlipidemia, unspecified: Secondary | ICD-10-CM | POA: Diagnosis not present

## 2015-01-23 DIAGNOSIS — R7303 Prediabetes: Secondary | ICD-10-CM

## 2015-01-23 DIAGNOSIS — G43009 Migraine without aura, not intractable, without status migrainosus: Secondary | ICD-10-CM

## 2015-01-23 DIAGNOSIS — G43809 Other migraine, not intractable, without status migrainosus: Secondary | ICD-10-CM

## 2015-01-23 LAB — BASIC METABOLIC PANEL
BUN: 21 mg/dL (ref 6–23)
CALCIUM: 9.5 mg/dL (ref 8.4–10.5)
CO2: 28 mEq/L (ref 19–32)
CREATININE: 0.78 mg/dL (ref 0.40–1.20)
Chloride: 104 mEq/L (ref 96–112)
GFR: 75.51 mL/min (ref >60.00)
Glucose, Bld: 91 mg/dL (ref 70–99)
Potassium: 4 mEq/L (ref 3.5–5.1)
Sodium: 140 mEq/L (ref 135–145)

## 2015-01-23 LAB — LIPID PANEL
CHOL/HDL RATIO: 5
Cholesterol: 261 mg/dL — ABNORMAL HIGH (ref 0–200)
HDL: 52.2 mg/dL (ref >39.00)
LDL CALC: 190 mg/dL — AB (ref 0–99)
NonHDL: 208.8
Triglycerides: 95 mg/dL (ref 0.0–149.0)
VLDL: 19 mg/dL (ref 0.0–40.0)

## 2015-01-23 LAB — TSH: TSH: 0.63 u[IU]/mL (ref 0.35–4.50)

## 2015-01-23 LAB — HEMOGLOBIN A1C: HEMOGLOBIN A1C: 5.9 % (ref 4.6–6.5)

## 2015-01-23 NOTE — Patient Instructions (Signed)
BEFORE YOU LEAVE: -labs -4 month follow up  -We have ordered labs and the ultrasound or studies at this visit. It can take up to 1-2 weeks for results and processing. We will contact you with instructions IF your results are abnormal. Normal results will be released to your [facility]. If you have not heard from [place] or can not find your results in [facility] in 2 weeks please contact our office.   We recommend the following healthy lifestyle measures: - eat a healthy diet consisting of lots of vegetables, fruits, beans, nuts, seeds, healthy meats such as white chicken and fish and whole grains.  - avoid fried foods, fast food, processed foods, sodas, red meet and other fattening foods.  - get a least 150 minutes of aerobic exercise per week.

## 2015-01-23 NOTE — Progress Notes (Signed)
Pre visit review using our clinic review tool, if applicable. No additional management support is needed unless otherwise documented below in the visit note. 

## 2015-01-23 NOTE — Progress Notes (Addendum)
HPI:  Leslie Burns is an 5 F with MMP here for follow up. She no showed her last appointment.  Vertigo - told atypical migraine: -dx 25 years ago -intermittent - maybe a few episodes per year -takes meclizine and anti-emetic ODT  Cyst on R thumb: -referred to hand surgery for removal 1-/2015  Non-healing skin lesion Nose: -she was to see her dermatologist for biopsy 1-/2015  HTN/PVCs/SVT:  -on atenolol, hctz  -denies: CP, SOB, swelling   HLD/Prediabetes:  -last check in May 2015 - LDL elevated but ratio ok and she refuses medication -denies: polyuria, polydipsia, vision changes  Thyroid Goiter/Elevated TSH/Normal free T4:  -evaluated by [facility] 12/3013, thought needed biopsy, then when went for biopsy apparently Dr. Joya San felt did not need biopsy and advised yearly [place] and TSH -reports no swelling of throat, hot/or cold flashes, fatigue, malaise   OA knees:  -on clucosmine-chondrointin  ROS: negative for report of fevers, unintentional weight loss, vision changes, vision loss, hearing loss or change, chest pain, sob, hemoptysis, melena, hematochezia, hematuria, genital discharge or lesions, falls, bleeding or bruising, loc, thoughts of suicide or self harm, memory loss   ROS: See pertinent positives and negatives per HPI.  Past Medical History  Diagnosis Date  . Chicken pox   . Seasonal allergies   . Hypertension     high readings  . High cholesterol   . Ovarian cyst 2007  . Vertigo   . SVT (supraventricular tachycardia)   . Neuromuscular disorder     numbness&tingling arms &hands  . PONV (postoperative nausea and vomiting)     everytime, always sick with anesth.   . Arthritis     "both knees" (01/22/2013)  . Basal cell carcinoma of back ` 1980's  . Cystocele or rectocele with uterine prolapse 09/05/2013  . Atypical migraine with veritgo 08/16/2014    diagnosed >25 years ago per her report, treats with meclizine and anti-emetic -  suppository or odt   . Enlarged thyroid, s/p eval by ENT in 11/2013, no nodule required biopsy and annual [place] and TSH advised 01/16/2014    Past Surgical History  Procedure Laterality Date  . Tonsillectomy and adenoidectomy  1945  . Combined hysteroscopy diagnostic / d&c  1958  . Combined hysteroscopy diagnostic / d&c  1972    tubal pregnancy  . Cyst excision      ovarian  . Total knee arthroplasty Right 01/22/2013  . Dilation and curettage of uterus    . Cataract extraction w/ intraocular lens  implant, bilateral Bilateral 2012  . Laparoscopic ovarian cystectomy  ~ 2006  . Av node ablation  1995  . Basal cell carcinoma excision  ?1980's    "off my back" (01/22/2013)  . Total knee arthroplasty Right 01/22/2013    Procedure: TOTAL KNEE ARTHROPLASTY;  Surgeon: Vickey Huger, MD;  Location: [facility];  Service: Orthopedics;  Laterality: Right;    Family History  Problem Relation Age of Onset  . Stroke Mother 33  . Heart disease Father 55    MI  . Alcohol abuse      parent  . Alcohol abuse      grandparent  . Arthritis      parent  . Hyperlipidemia      parent  . Hypertension      parent    History   Social History  . Marital Status: Married    Spouse Name: N/A  . Number of Children: N/A  . Years of Education: N/A  Social History Main Topics  . Smoking status: Never Smoker   . Smokeless tobacco: Never Used  . Alcohol Use: No  . Drug Use: No  . Sexual Activity: No   Other Topics Concern  . None   Social History Narrative   Work or School: homemaker      Home Situation: lives with husband who has parkinson and dementia - she is the sole caregiver      Spiritual Beliefs: christian      Lifestyle: was exercising until the summer - walks about 1/3 of a mile on a regular basis, only limited by knee pain, can walk of a flight of steps without any SOB              Current outpatient prescriptions:  .  Ascorbic Acid (VITAMIN C PO), Take 1 tablet by mouth daily., Disp:  , Rfl:  .  atenolol (TENORMIN) 25 MG tablet, TAKE ONE TABLET BY MOUTH ONCE DAILY, Disp: 90 tablet, Rfl: 0 .  calcium citrate (CALCITRATE - DOSED IN MG ELEMENTAL CALCIUM) 950 MG tablet, Take 1 tablet by mouth daily., Disp: , Rfl:  .  Cholecalciferol (VITAMIN D3) 5000 UNITS TABS, Take 1 tablet by mouth daily., Disp: , Rfl:  .  Coenzyme Q10 (CO Q 10) 100 MG CAPS, Take 1 capsule by mouth daily., Disp: , Rfl:  .  Glucosamine-Chondroitin-MSM (TRIPLE FLEX PO), Take 2 capsules by mouth daily., Disp: , Rfl:  .  hydrochlorothiazide (HYDRODIURIL) 25 MG tablet, TAKE ONE TABLET BY MOUTH ONCE DAILY, Disp: 90 tablet, Rfl: 0 .  meclizine (ANTIVERT) 25 MG tablet, TAKE ONE-HALF TABLET BY MOUTH THREE TIMES DAILY AS NEEDED FOR DIZZINESS, Disp: 30 tablet, Rfl: 0 .  NONFORMULARY OR COMPOUNDED ITEM, Estradiol .02% 1 ML Prefilled Applicator Sig: apply vaginally twice a week #90 Day Supply with 4 refills, Disp: 1 each, Rfl: 4 .  Nutritional Supplements (JUICE PLUS FIBRE PO), Take 2 tablets by mouth daily., Disp: , Rfl:  .  ondansetron (ZOFRAN ODT) 4 MG disintegrating tablet, Take 1 tablet (4 mg total) by mouth every 8 (eight) hours as needed for nausea or vomiting., Disp: 20 tablet, Rfl: 0  EXAM:  Filed Vitals:   01/23/15 0856  BP: 128/70  Pulse: 62  Temp: 97.8 F (36.6 C)    Body mass index is 26.74 kg/(m^2).  GENERAL: vitals reviewed and listed above, alert, oriented, appears well hydrated and in no acute distress  HEENT: atraumatic, conjunttiva clear, no obvious abnormalities on inspection of external nose and ears  NECK: fullness over R thyroid  LUNGS: clear to auscultation bilaterally, no wheezes, rales or rhonchi, good air movement  CV: HRRR, no peripheral edema  MS: moves all extremities without noticeable abnormality  PSYCH: pleasant and cooperative, no obvious depression or anxiety  ASSESSMENT AND PLAN:  Discussed the following assessment and plan:  Essential hypertension - Plan: Basic  metabolic panel  Hyperlipemia - Plan: Lipid Panel  Prediabetes - Plan: Hemoglobin A1c  Enlarged thyroid, s/p eval by ENT in 11/2013, no nodule required biopsy and annual [place] and TSH advised - Plan: US Soft Tissue Head/Neck, TSH  Atypical migraine with veritgo  -FASTING lipids and hgba1c -yearly TSH and [place] for thyroid nodule orders placed and discussed -lifestyle recs -follow up in 4 months -prevnar 13 offered - declined -Patient advised to return or notify a doctor immediately if symptoms worsen or persist or new concerns arise.  Patient Instructions  BEFORE YOU LEAVE: -labs -4 month follow up  -We  have ordered labs and the ultrasound or studies at this visit. It can take up to 1-2 weeks for results and processing. We will contact you with instructions IF your results are abnormal. Normal results will be released to your [facility]. If you have not heard from [place] or can not find your results in [facility] in 2 weeks please contact our office.   We recommend the following healthy lifestyle measures: - eat a healthy diet consisting of lots of vegetables, fruits, beans, nuts, seeds, healthy meats such as white chicken and fish and whole grains.  - avoid fried foods, fast food, processed foods, sodas, red meet and other fattening foods.  - get a least 150 minutes of aerobic exercise per week.          Colin Benton R.

## 2015-01-24 ENCOUNTER — Encounter: Payer: Self-pay | Admitting: *Deleted

## 2015-03-31 ENCOUNTER — Other Ambulatory Visit: Payer: Self-pay | Admitting: Family Medicine

## 2015-04-26 ENCOUNTER — Other Ambulatory Visit: Payer: Self-pay | Admitting: Family Medicine

## 2015-05-06 ENCOUNTER — Other Ambulatory Visit: Payer: Self-pay | Admitting: Family Medicine

## 2015-05-26 ENCOUNTER — Encounter: Payer: Self-pay | Admitting: Family Medicine

## 2015-05-26 ENCOUNTER — Ambulatory Visit (INDEPENDENT_AMBULATORY_CARE_PROVIDER_SITE_OTHER): Payer: PPO | Admitting: Family Medicine

## 2015-05-26 VITALS — BP 128/70 | HR 58 | Temp 98.3°F | Ht 66.0 in | Wt 163.5 lb

## 2015-05-26 DIAGNOSIS — E049 Nontoxic goiter, unspecified: Secondary | ICD-10-CM

## 2015-05-26 DIAGNOSIS — G43009 Migraine without aura, not intractable, without status migrainosus: Secondary | ICD-10-CM

## 2015-05-26 DIAGNOSIS — I471 Supraventricular tachycardia: Secondary | ICD-10-CM

## 2015-05-26 DIAGNOSIS — E785 Hyperlipidemia, unspecified: Secondary | ICD-10-CM | POA: Diagnosis not present

## 2015-05-26 DIAGNOSIS — I10 Essential (primary) hypertension: Secondary | ICD-10-CM | POA: Diagnosis not present

## 2015-05-26 DIAGNOSIS — G43809 Other migraine, not intractable, without status migrainosus: Secondary | ICD-10-CM

## 2015-05-26 MED ORDER — ATENOLOL 25 MG PO TABS: 25.0000 mg | ORAL_TABLET | Freq: Every day | ORAL | Status: AC

## 2015-05-26 MED ORDER — HYDROCHLOROTHIAZIDE 25 MG PO TABS: 25.0000 mg | ORAL_TABLET | Freq: Every day | ORAL | Status: AC

## 2015-05-26 NOTE — Patient Instructions (Signed)
BEFORE YOU LEAVE: -follow up in 4-6 months  I have advised my scheduler to check on the status of you Ultrasound for your thyroid - please call us if you have not heard about this appointment in the next 1 week.

## 2015-05-26 NOTE — Progress Notes (Signed)
Pre visit review using our clinic review tool, if applicable. No additional management support is needed unless otherwise documented below in the visit note. 

## 2015-05-26 NOTE — Progress Notes (Signed)
HPI:  Leslie Burns is an 38 F with MMP here for follow up.   HTN/PVCs/SVT:  -on atenolol, hctz  -denies: CP, SOB, swelling   HLD/Prediabetes:  -last check in May 2015 - LDL elevated but ratio ok and she refuses  -she has refused medication for this and declines further checks -denies: polyuria, polydipsia, vision changes  Thyroid Goiter/Elevated TSH/Normal free T4:  -evaluated by [facility] ENT 12/3013, thought needed biopsy, then when went for biopsy apparently Dr. Joya San felt did not need biopsy and advised yearly [place] and TSH -She did not have the repeat US I ordered at the last visit- reports did not receive a cal regarding this -reports no swelling of throat, hot/or cold flashes, fatigue, malaise   OA knees:  -on glucosmine-chondrointin  Vertigo - told atypical migraine: -dx 25 years ago -intermittent - maybe a few episodes per year -takes meclizine and anti-emetic ODT  Cyst on R thumb: -referred to hand surgery for removal 1-/2015  Non-healing skin lesion Nose: -she was to see her dermatologist for biopsy 1-/2015  ROS: negative for report of fevers, unintentional weight loss, vision changes, vision loss, hearing loss or change, chest pain, sob, hemoptysis, melena, hematochezia, hematuria, genital discharge or lesions, falls, bleeding or bruising, loc, thoughts of suicide or self harm, memory loss  ROS: See pertinent positives and negatives per HPI.  Past Medical History  Diagnosis Date  . Chicken pox   . Seasonal allergies   . Hypertension     high readings  . High cholesterol   . Ovarian cyst 2007  . Vertigo   . SVT (supraventricular tachycardia)   . Neuromuscular disorder     numbness&tingling arms &hands  . PONV (postoperative nausea and vomiting)     everytime, always sick with anesth.   . Arthritis     "both knees" (01/22/2013)  . Basal cell carcinoma of back ` 1980's  . Cystocele or rectocele with uterine prolapse 09/05/2013  . Atypical  migraine with veritgo 08/16/2014    diagnosed >25 years ago per her report, treats with meclizine and anti-emetic - suppository or odt   . Enlarged thyroid, s/p eval by ENT in 11/2013, no nodule required biopsy and annual [place] and TSH advised 01/16/2014    Past Surgical History  Procedure Laterality Date  . Tonsillectomy and adenoidectomy  1945  . Combined hysteroscopy diagnostic / d&c  1958  . Combined hysteroscopy diagnostic / d&c  1972    tubal pregnancy  . Cyst excision      ovarian  . Total knee arthroplasty Right 01/22/2013  . Dilation and curettage of uterus    . Cataract extraction w/ intraocular lens  implant, bilateral Bilateral 2012  . Laparoscopic ovarian cystectomy  ~ 2006  . Av node ablation  1995  . Basal cell carcinoma excision  ?1980's    "off my back" (01/22/2013)  . Total knee arthroplasty Right 01/22/2013    Procedure: TOTAL KNEE ARTHROPLASTY;  Surgeon: Vickey Huger, MD;  Location: [place];  Service: Orthopedics;  Laterality: Right;    Family History  Problem Relation Age of Onset  . Stroke Mother 48  . Heart disease Father 49    MI  . Alcohol abuse      parent  . Alcohol abuse      grandparent  . Arthritis      parent  . Hyperlipidemia      parent  . Hypertension      parent    History  Social History  . Marital Status: Married    Spouse Name: N/A  . Number of Children: N/A  . Years of Education: N/A   Social History Main Topics  . Smoking status: Never Smoker   . Smokeless tobacco: Never Used  . Alcohol Use: No  . Drug Use: No  . Sexual Activity: No   Other Topics Concern  . None   Social History Narrative   Work or School: homemaker      Home Situation: lives with husband who has parkinson and dementia - she is the sole caregiver      Spiritual Beliefs: christian      Lifestyle: was exercising until the summer - walks about 1/3 of a mile on a regular basis, only limited by knee pain, can walk of a flight of steps without any SOB               Current outpatient prescriptions:  .  Ascorbic Acid (VITAMIN C PO), Take 1 tablet by mouth daily., Disp: , Rfl:  .  atenolol (TENORMIN) 25 MG tablet, Take 1 tablet (25 mg total) by mouth daily., Disp: 90 tablet, Rfl: 3 .  calcium citrate (CALCITRATE - DOSED IN MG ELEMENTAL CALCIUM) 950 MG tablet, Take 1 tablet by mouth daily., Disp: , Rfl:  .  Cholecalciferol (VITAMIN D3) 5000 UNITS TABS, Take 1 tablet by mouth daily., Disp: , Rfl:  .  Coenzyme Q10 (CO Q 10) 100 MG CAPS, Take 1 capsule by mouth daily., Disp: , Rfl:  .  Glucosamine-Chondroitin-MSM (TRIPLE FLEX PO), Take 2 capsules by mouth daily., Disp: , Rfl:  .  hydrochlorothiazide (HYDRODIURIL) 25 MG tablet, Take 1 tablet (25 mg total) by mouth daily., Disp: 90 tablet, Rfl: 3 .  meclizine (ANTIVERT) 25 MG tablet, TAKE ONE-HALF TABLET BY MOUTH THREE TIMES DAILY AS NEEDED FOR DIZZINESS, Disp: 30 tablet, Rfl: 0 .  NONFORMULARY OR COMPOUNDED ITEM, Estradiol .02% 1 ML Prefilled Applicator Sig: apply vaginally twice a week #90 Day Supply with 4 refills, Disp: 1 each, Rfl: 4 .  Nutritional Supplements (JUICE PLUS FIBRE PO), Take 2 tablets by mouth daily., Disp: , Rfl:  .  ondansetron (ZOFRAN ODT) 4 MG disintegrating tablet, Take 1 tablet (4 mg total) by mouth every 8 (eight) hours as needed for nausea or vomiting., Disp: 20 tablet, Rfl: 0  EXAM:  Filed Vitals:   05/26/15 1059  BP: 128/70  Pulse: 58  Temp: 98.3 F (36.8 C)    Body mass index is 26.4 kg/(m^2).  GENERAL: vitals reviewed and listed above, alert, oriented, appears well hydrated and in no acute distress  HEENT: atraumatic, conjunttiva clear, no obvious abnormalities on inspection of external nose and ears  NECK: no obvious masses on inspection  LUNGS: clear to auscultation bilaterally, no wheezes, rales or rhonchi, good air movement  CV: HRRR, no peripheral edema  MS: moves all extremities without noticeable abnormality  PSYCH: pleasant and cooperative, no  obvious depression or anxiety  ASSESSMENT AND PLAN:  Discussed the following assessment and plan:  SVT (supraventricular tachycardia) -stable  Essential hypertension -stable  Hyperlipemia -stable  Enlarged thyroid, s/p eval by ENT in 11/2013, no nodule required biopsy and annual [place] and TSH advised -ordered US last visit but not done, pt reluctant to do as feels uneeded - advised reason for repeat -biopsy advised initial [place], then radiologist performing bxs felt not needed and advised repeat US in 1 year -advised scheduler to check on this prior to pt leaving today,  advised pt to call in one week if does not have details of appt Atypical migraine with veritgo  -Patient advised to return or notify a doctor immediately if symptoms worsen or persist or new concerns arise.  Patient Instructions  BEFORE YOU LEAVE: -follow up in 4-6 months  I have advised my scheduler to check on the status of you Ultrasound for your thyroid - please call us if you have not heard about this appointment in the next 1 week.     Colin Benton R.

## 2015-05-27 ENCOUNTER — Ambulatory Visit
Admission: RE | Admit: 2015-05-27 | Discharge: 2015-05-27 | Disposition: A | Discharge status: Home / Self Care | Payer: PPO | Source: Ambulatory Visit | Attending: Family Medicine | Admitting: Family Medicine

## 2015-05-27 DIAGNOSIS — E049 Nontoxic goiter, unspecified: Secondary | ICD-10-CM

## 2015-09-08 ENCOUNTER — Encounter: Payer: Self-pay | Admitting: Gynecology

## 2015-09-08 ENCOUNTER — Ambulatory Visit (INDEPENDENT_AMBULATORY_CARE_PROVIDER_SITE_OTHER): Payer: PPO | Admitting: Gynecology

## 2015-09-08 VITALS — BP 130/70 | Ht 66.0 in | Wt 155.0 lb

## 2015-09-08 DIAGNOSIS — N952 Postmenopausal atrophic vaginitis: Secondary | ICD-10-CM | POA: Diagnosis not present

## 2015-09-08 DIAGNOSIS — N816 Rectocele: Secondary | ICD-10-CM

## 2015-09-08 DIAGNOSIS — Z01419 Encounter for gynecological examination (general) (routine) without abnormal findings: Secondary | ICD-10-CM

## 2015-09-08 DIAGNOSIS — N814 Uterovaginal prolapse, unspecified: Secondary | ICD-10-CM | POA: Diagnosis not present

## 2015-09-08 DIAGNOSIS — M8580 Other specified disorders of bone density and structure, unspecified site: Secondary | ICD-10-CM | POA: Diagnosis not present

## 2015-09-08 DIAGNOSIS — N8110 Cystocele, unspecified: Secondary | ICD-10-CM

## 2015-09-08 DIAGNOSIS — IMO0002 Reserved for concepts with insufficient information to code with codable children: Secondary | ICD-10-CM

## 2015-09-08 DIAGNOSIS — Z79890 Hormone replacement therapy (postmenopausal): Secondary | ICD-10-CM | POA: Diagnosis not present

## 2015-09-08 MED ORDER — NONFORMULARY OR COMPOUNDED ITEM: Status: AC

## 2015-09-08 NOTE — Progress Notes (Signed)
Leslie Burns 27-Feb-1933 [id number]   History:    81 y.o.  for annual gyn exam  With history of pelvic organ prolapse has a Gellhorn pessary that she uses for support. She had been referred to the urologist in the past because her stress urinary incontinence an for possible combination surgery but she has opted to hold off any surgery.Her PCP is Dr. Maudie Mercury who recently has evaluated her with ultrasound scans of her neck for multinodular goiter and had been referred to the endocrinologist for which she  Is being followed and had a recent neck ultrasound.Patient has declined colonoscopy.    bone density study done in November 2015 in our office demonstrated the following:  Right hip T score was -1.1 with a Frax analysis indicating a 9.6% 10 year fracture risk exceeding the 3% threshold cut off. Her risk of any major osteoporotic fracture in the next 10 years based on Frax analysis was 19% almost at cut off. Patient opted to hold off any anti-resorptive agent at the present time and await next year's bone density study. Patient's calcium, vitamin D and PTH level less than one year ago were normal.  Past medical history,surgical history, family history and social history were all reviewed and documented in the EPIC chart.  Gynecologic History No LMP recorded. Patient is postmenopausal. Contraception: post menopausal status Last Pap:  2013. Results were: normal Last mammogram:  6 years ago. Results were: normal  Obstetric History OB History  Gravida Para Term Preterm AB SAB TAB Ectopic Multiple Living  10 8   2 2    8     # Outcome Date GA Lbr Len/2nd Weight Sex Delivery Anes PTL Lv  10 SAB           9 SAB           8 Para           7 Para           6 Para           5 Para           4 Para           3 Para           2 Para           1 Para                ROS: A ROS was performed and pertinent positives and negatives are included in the history.  GENERAL: No fevers or chills.  HEENT: No change in vision, no earache, sore throat or sinus congestion. NECK: No pain or stiffness. CARDIOVASCULAR: No chest pain or pressure. No palpitations. PULMONARY: No shortness of breath, cough or wheeze. GASTROINTESTINAL: No abdominal pain, nausea, vomiting or diarrhea, melena or bright red blood per rectum. GENITOURINARY: No urinary frequency, urgency, hesitancy or dysuria. MUSCULOSKELETAL: No joint or muscle pain, no back pain, no recent trauma. DERMATOLOGIC: No rash, no itching, no lesions. ENDOCRINE: No polyuria, polydipsia, no heat or cold intolerance. No recent change in weight. HEMATOLOGICAL: No anemia or easy bruising or bleeding. NEUROLOGIC: No headache, seizures, numbness, tingling or weakness. PSYCHIATRIC: No depression, no loss of interest in normal activity or change in sleep pattern.     Exam: chaperone present  BP 130/70 mmHg  Ht 5\' 6"  (1.676 m)  Wt 155 lb (70.308 kg)  BMI 25.03 kg/m2  Body mass index is 25.03 kg/(m^2).  General appearance : Well developed well  nourished female. No acute distress HEENT: Eyes: no retinal hemorrhage or exudates,  Neck supple, t thyromegaly no carotid bruits Lungs: Clear to auscultation, no rhonchi or wheezes, or rib retractions  Heart: Regular rate and rhythm, no murmurs or gallops Breast:Examined in sitting and supine position were symmetrical in appearance, no palpable masses or tenderness,  no skin retraction, no nipple inversion, no nipple discharge, no skin discoloration, no axillary or supraclavicular lymphadenopathy Abdomen: no palpable masses or tenderness, no rebound or guarding Extremities: no edema or skin discoloration or tenderness  Pelvic: Gellhorn pessary removed Bartholin's urethra Skene glands with atrophic changes Vagina the ring pessary was removed and on Valsalva patient with a protruding cystocele although the cervix appears distended mildly.There was a mild first degree rectocele. Patient was examined in the supine  and erect position I do not feel an enterocele   Assessment/Plan:  81 y.o. female for annual exam  Has done well with her Gellhorn pessary for support. Prescription refill for vaginal estrogen to apply one to 2 times a week. If she has difficulty  Taking the Gellhorn pessary out she can return every 3 months we'll clean it and reinserted for her. It was cleaned today. Her PCP has been doing her blood work. Patient with known history of thyromegaly being followed by PCP an endocrinologist. Pap smear no longer indicated. Patient with osteopenia with high-risk for fracture based on Frax analysis decided to wait on medication until next years on density result. All vaccines up-to-date.   Terrance Mass MD, 3:22 PM 09/08/2015

## 2015-09-10 ENCOUNTER — Other Ambulatory Visit: Payer: Self-pay | Admitting: Family Medicine

## 2015-09-12 IMAGING — US US SOFT TISSUE HEAD/NECK
1 series · 13 of 25 positions shown · non-contrast
Comparison: 12/27/2013 01/08/2014

CLINICAL DATA: Multinodular thyroid gland with previous
demonstration of multiple bilateral nodules.

EXAM:
THYROID ULTRASOUND
TECHNIQUE: Ultrasound examination of the thyroid gland and adjacent soft
tissues was performed.

[Series 1: us soft tissue head/neck · 0.12mm/px · 13 of 64 slices shown]
[im 1/64]
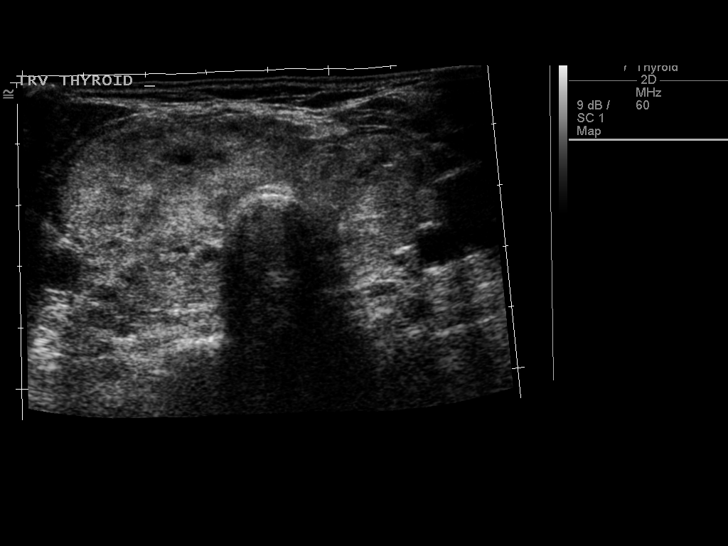
[im 6/64]
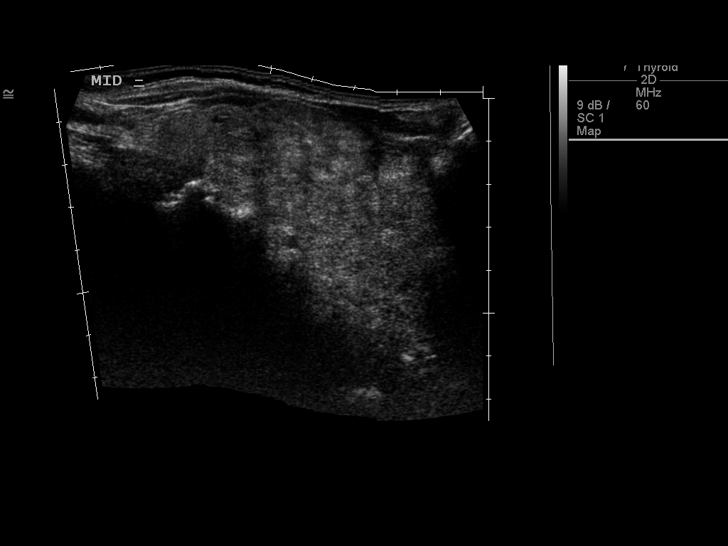
[im 11/64]
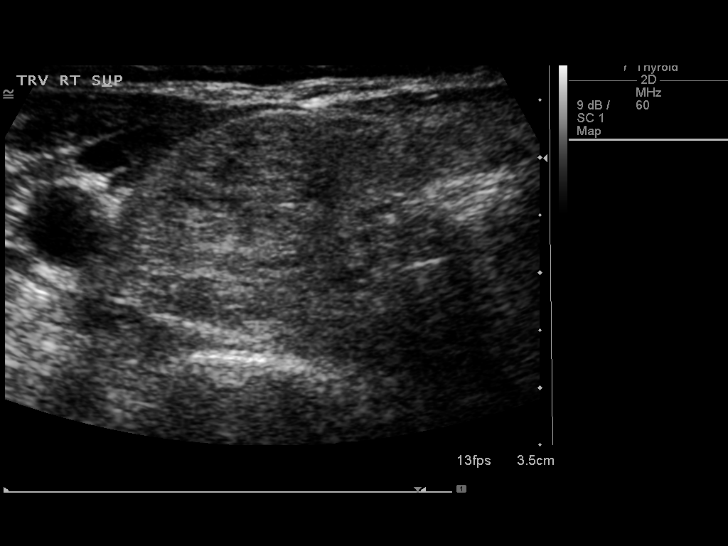
[im 16/64]
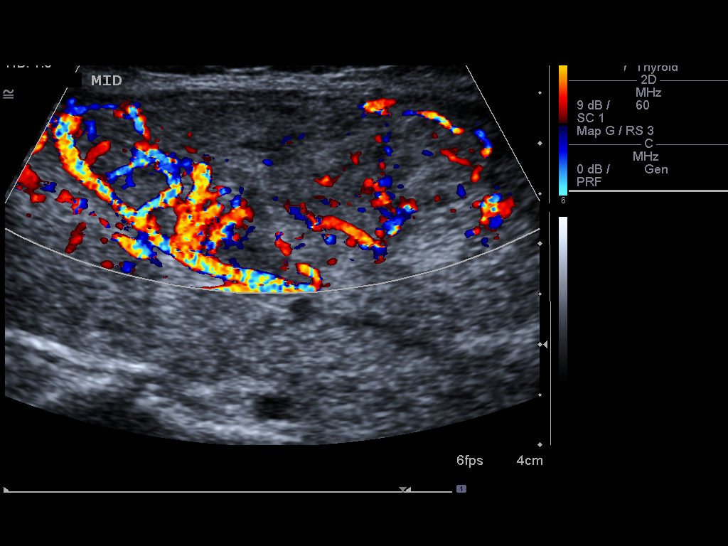
[im 22/64]
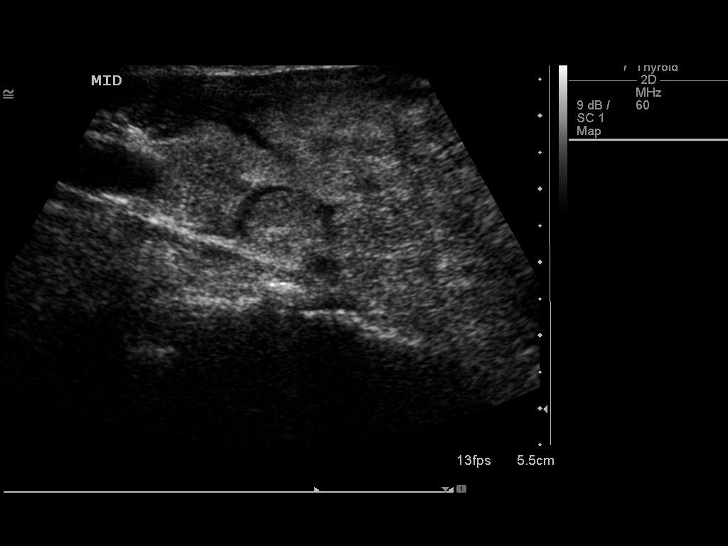
[im 27/64]
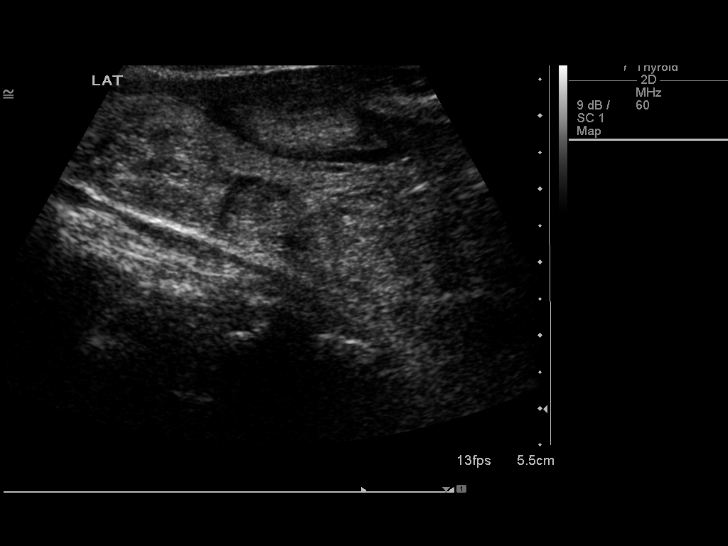
[im 32/64]
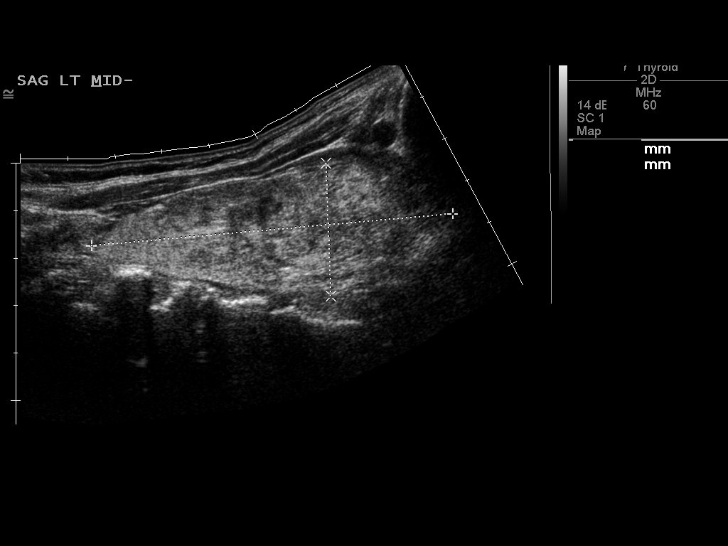
[im 37/64]
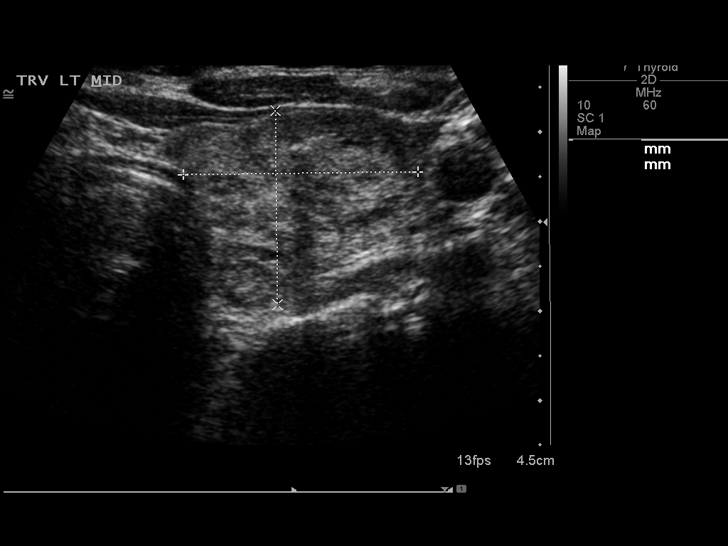
[im 43/64]
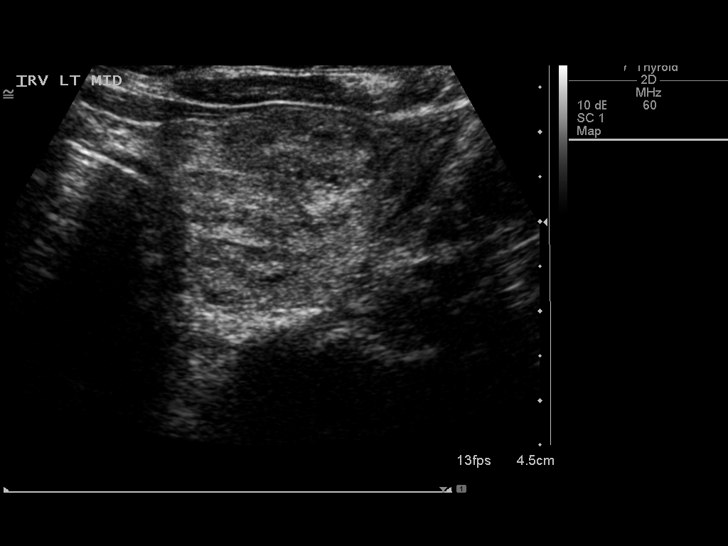
[im 48/64]
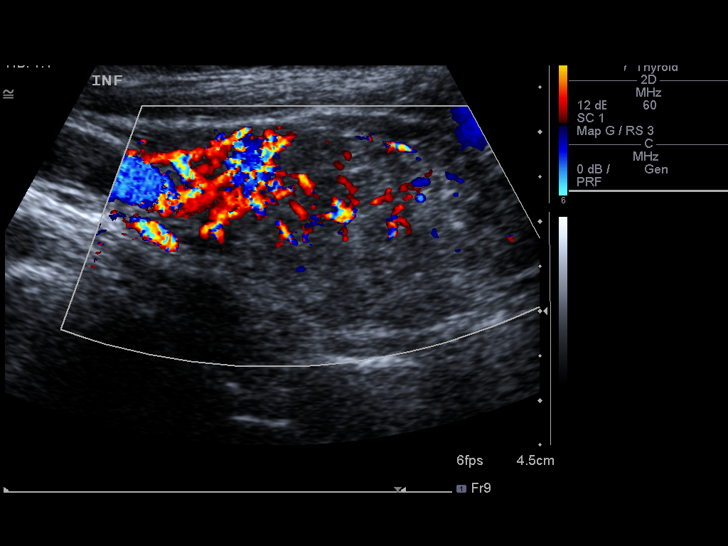
[im 53/64]
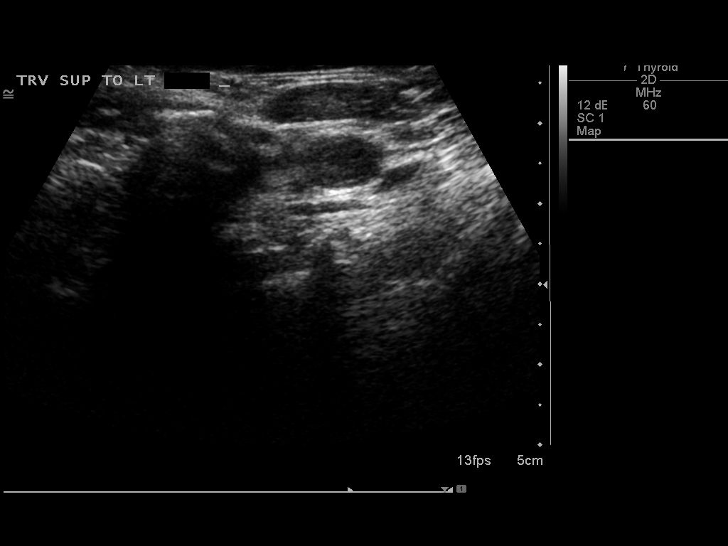
[im 58/64]
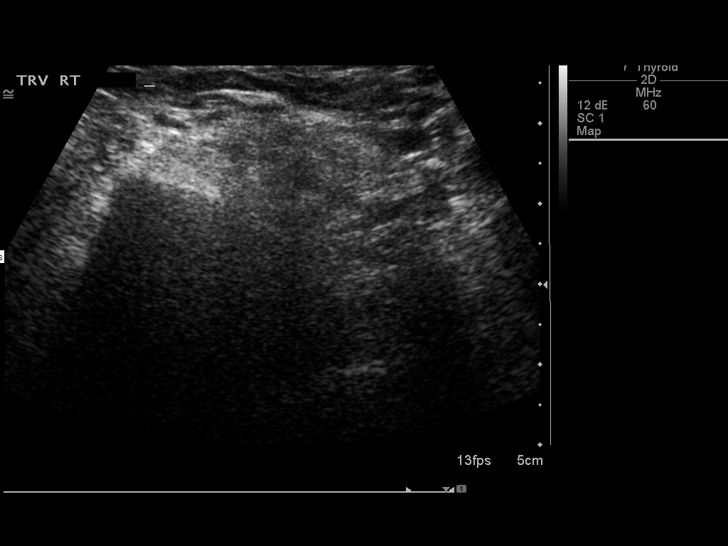
[im 64/64]
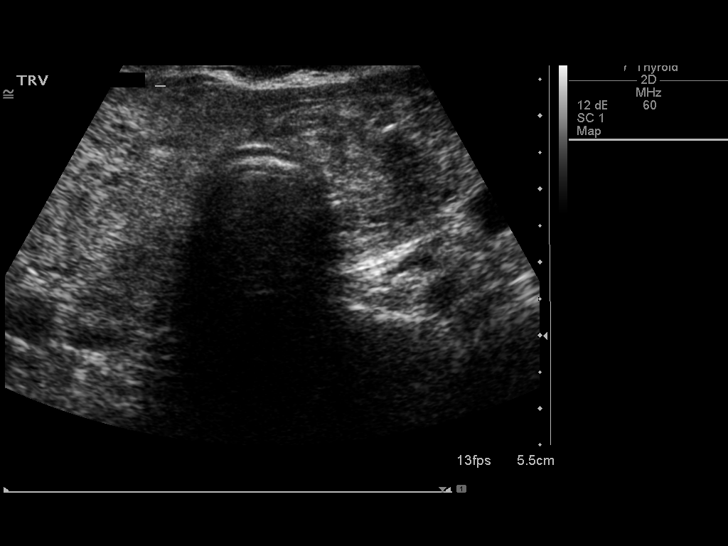

[13 of 25 positions shown; findings below may reference images not displayed]

FINDINGS: Right thyroid lobe

Measurements: 8.5 x 3.4 x 3.2 cm (similar size as previous
measurements). Stable diffuse multinodular appearance with multiple
areas of subtle nodularity blending into one another. Again,
discrete nodularity is difficult to accurately measure based on
appearance. The largest dominant area of nodularity in the
midportion of the right lobe appears stable and measures roughly 4
cm. No abnormal calcifications identified.

Left thyroid lobe

Measurements: 7.6 x 2.8 x 2.5 cm (similar size as previous
measurements). Stable diffuse multinodular appearance. It is
difficult to accurately measure discrete areas of nodularity based
on appearance. Dominant area of nodularity measures roughly 2.5 cm
and appears stable.

Isthmus

Thickness: 1.0 cm.  No nodules visualized.

Lymphadenopathy

None visualized.
IMPRESSION: Stable diffusely enlarged and multinodular thyroid gland. The gland
shows similar dimensions and no significant enlargement since prior
studies in 5719. Nodule measurement is difficult due to diffuse
heterogeneous appearance of both lobes. Dominant areas of nodularity
appear stable bilaterally.

## 2015-11-03 ENCOUNTER — Ambulatory Visit: Payer: PPO | Admitting: Family Medicine

## 2015-11-10 ENCOUNTER — Ambulatory Visit (INDEPENDENT_AMBULATORY_CARE_PROVIDER_SITE_OTHER): Payer: PPO | Admitting: Family Medicine

## 2015-11-10 ENCOUNTER — Encounter: Payer: Self-pay | Admitting: Family Medicine

## 2015-11-10 VITALS — BP 128/70 | HR 63 | Temp 97.7°F | Ht 66.0 in | Wt 155.7 lb

## 2015-11-10 DIAGNOSIS — E785 Hyperlipidemia, unspecified: Secondary | ICD-10-CM

## 2015-11-10 DIAGNOSIS — E049 Nontoxic goiter, unspecified: Secondary | ICD-10-CM | POA: Diagnosis not present

## 2015-11-10 DIAGNOSIS — I10 Essential (primary) hypertension: Secondary | ICD-10-CM

## 2015-11-10 DIAGNOSIS — Z23 Encounter for immunization: Secondary | ICD-10-CM

## 2015-11-10 DIAGNOSIS — I471 Supraventricular tachycardia: Secondary | ICD-10-CM

## 2015-11-10 NOTE — Progress Notes (Signed)
HPI:  Leslie Burns is an 59 F with MMP here for follow up.  HM: flu and shingles vaccines, pneumonia vaccines   HTN/PVCs/SVT:  -on atenolol, hctz  -denies: CP, SOB, swelling   HLD/Prediabetes:  -she has refused medication for this and declines further checks -reports has been really working on diet and getting some exercise and has lost weight and is doing better -denies: polyuria, polydipsia, vision changes  Thyroid Goiter/Elevated TSH/Normal free T4:  -evaluated by [facility] ENT 12/3013, thought needed biopsy, then when went for biopsy apparently Dr. Joya San felt did not need biopsy and advised yearly [place] and TSH -Per report, stable 05/2015 [place] -reports no swelling of throat, hot/or cold flashes, fatigue, malaise   OA knees:  -on glucosmine-chondrointin  Vertigo - told atypical migraine: -dx 25 years ago -intermittent - maybe a few episodes per year -takes meclizine and anti-emetic ODT  ROS: See pertinent positives and negatives per HPI.  Past Medical History  Diagnosis Date  . Chicken pox   . Seasonal allergies   . Hypertension     high readings  . High cholesterol   . Ovarian cyst 2007  . Vertigo   . SVT (supraventricular tachycardia) ([place])   . Neuromuscular disorder ([place])     numbness&tingling arms &hands  . PONV (postoperative nausea and vomiting)     everytime, always sick with anesth.   . Arthritis     "both knees" (01/22/2013)  . Basal cell carcinoma of back ` 1980's  . Cystocele or rectocele with uterine prolapse 09/05/2013  . Atypical migraine with veritgo 08/16/2014    diagnosed >25 years ago per her report, treats with meclizine and anti-emetic - suppository or odt   . Enlarged thyroid, s/p eval by ENT in 11/2013, no nodule required biopsy and annual [place] and TSH advised 01/16/2014    Past Surgical History  Procedure Laterality Date  . Tonsillectomy and adenoidectomy  1945  . Combined hysteroscopy diagnostic / d&c  1958  . Combined  hysteroscopy diagnostic / d&c  1972    tubal pregnancy  . Cyst excision      ovarian  . Total knee arthroplasty Right 01/22/2013  . Dilation and curettage of uterus    . Cataract extraction w/ intraocular lens  implant, bilateral Bilateral 2012  . Laparoscopic ovarian cystectomy  ~ 2006  . Av node ablation  1995  . Basal cell carcinoma excision  ?1980's    "off my back" (01/22/2013)  . Total knee arthroplasty Right 01/22/2013    Procedure: TOTAL KNEE ARTHROPLASTY;  Surgeon: Vickey Huger, MD;  Location: [place];  Service: Orthopedics;  Laterality: Right;    Family History  Problem Relation Age of Onset  . Stroke Mother 16  . Heart disease Father 97    MI  . Alcohol abuse      parent  . Alcohol abuse      grandparent  . Arthritis      parent  . Hyperlipidemia      parent  . Hypertension      parent    Social History   Social History  . Marital Status: Married    Spouse Name: N/A  . Number of Children: N/A  . Years of Education: N/A   Social History Main Topics  . Smoking status: Never Smoker   . Smokeless tobacco: Never Used  . Alcohol Use: No  . Drug Use: No  . Sexual Activity: No   Other Topics Concern  . None  Social History Narrative   Work or School: homemaker      Home Situation: lives with husband who has parkinson and dementia - she is the sole caregiver      Spiritual Beliefs: christian      Lifestyle: was exercising until the summer - walks about 1/3 of a mile on a regular basis, only limited by knee pain, can walk of a flight of steps without any SOB              Current outpatient prescriptions:  .  Ascorbic Acid (VITAMIN C PO), Take 1 tablet by mouth daily., Disp: , Rfl:  .  atenolol (TENORMIN) 25 MG tablet, Take 1 tablet (25 mg total) by mouth daily., Disp: 90 tablet, Rfl: 3 .  calcium citrate (CALCITRATE - DOSED IN MG ELEMENTAL CALCIUM) 950 MG tablet, Take 1 tablet by mouth daily., Disp: , Rfl:  .  Cholecalciferol (VITAMIN D3) 5000 UNITS  TABS, Take 1 tablet by mouth daily., Disp: , Rfl:  .  Coenzyme Q10 (CO Q 10) 100 MG CAPS, Take 1 capsule by mouth daily., Disp: , Rfl:  .  Glucosamine-Chondroitin-MSM (TRIPLE FLEX PO), Take 2 capsules by mouth daily., Disp: , Rfl:  .  hydrochlorothiazide (HYDRODIURIL) 25 MG tablet, Take 1 tablet (25 mg total) by mouth daily., Disp: 90 tablet, Rfl: 3 .  meclizine (ANTIVERT) 25 MG tablet, TAKE ONE-HALF TABLET BY MOUTH THREE TIMES DAILY AS NEEDED FOR DIZZINESS, Disp: 30 tablet, Rfl: 0 .  NONFORMULARY OR COMPOUNDED ITEM, Estradiol .02% 1 ML Prefilled Applicator Sig: apply vaginally twice a week #90 Day Supply with 4 refills, Disp: 1 each, Rfl: 4 .  Nutritional Supplements (JUICE PLUS FIBRE PO), Take 2 tablets by mouth daily., Disp: , Rfl:  .  ondansetron (ZOFRAN ODT) 4 MG disintegrating tablet, Take 1 tablet (4 mg total) by mouth every 8 (eight) hours as needed for nausea or vomiting., Disp: 20 tablet, Rfl: 0  EXAM:  Filed Vitals:   11/10/15 1055  BP: 128/70  Pulse: 63  Temp: 97.7 F (36.5 C)    Body mass index is 25.14 kg/(m^2).  GENERAL: vitals reviewed and listed above, alert, oriented, appears well hydrated and in no acute distress  HEENT: atraumatic, conjunttiva clear, no obvious abnormalities on inspection of external nose and ears  NECK: no obvious masses on inspection  LUNGS: clear to auscultation bilaterally, no wheezes, rales or rhonchi, good air movement  CV: HRRR, no peripheral edema  MS: moves all extremities without noticeable abnormality  PSYCH: pleasant and cooperative, no obvious depression or anxiety  ASSESSMENT AND PLAN:  Discussed the following assessment and plan:  Essential hypertension  Hyperlipemia  SVT (supraventricular tachycardia) (HCC)  Enlarged thyroid, s/p eval by ENT in 11/2013, no nodule required biopsy and annual [place] and TSH advised  -congratulated on lifestyle changes -labs at medicare wellness visit -Patient advised to return or notify a  doctor immediately if symptoms worsen or persist or new concerns arise.  Patient Instructions  BEFORE YOU LEAVE: -prevnar 13 -medicare annual wellness exam in 3 months - will plan to do labs at that visit  We recommend the following healthy lifestyle measures: - eat a healthy whole foods diet consisting of regular small meals composed of vegetables, fruits, beans, nuts, seeds, healthy meats such as white chicken and fish and whole grains.  - avoid sweets, white starchy foods, fried foods, fast food, processed foods, sodas, red meet and other fattening foods.  - get a least 150-300 minutes of  aerobic exercise per week.       Colin Benton R.

## 2015-11-10 NOTE — Progress Notes (Signed)
Pre visit review using our clinic review tool, if applicable. No additional management support is needed unless otherwise documented below in the visit note. 

## 2015-11-10 NOTE — Patient Instructions (Signed)
BEFORE YOU LEAVE: -prevnar 13 -medicare annual wellness exam in 3 months - will plan to do labs at that visit  We recommend the following healthy lifestyle measures: - eat a healthy whole foods diet consisting of regular small meals composed of vegetables, fruits, beans, nuts, seeds, healthy meats such as white chicken and fish and whole grains.  - avoid sweets, white starchy foods, fried foods, fast food, processed foods, sodas, red meet and other fattening foods.  - get a least 150-300 minutes of aerobic exercise per week.

## 2015-11-10 NOTE — Addendum Note (Signed)
Addended by: Agnes Lawrence on: 11/10/2015 11:26 AM   Modules accepted: Orders

## 2015-12-30 ENCOUNTER — Ambulatory Visit: Payer: PPO | Admitting: Gynecology

## 2016-01-19 ENCOUNTER — Ambulatory Visit (INDEPENDENT_AMBULATORY_CARE_PROVIDER_SITE_OTHER): Payer: PPO | Admitting: Women's Health

## 2016-01-19 ENCOUNTER — Encounter: Payer: Self-pay | Admitting: Women's Health

## 2016-01-19 VITALS — BP 126/82 | Ht 66.0 in | Wt 155.0 lb

## 2016-01-19 DIAGNOSIS — Z4689 Encounter for fitting and adjustment of other specified devices: Secondary | ICD-10-CM | POA: Diagnosis not present

## 2016-01-19 NOTE — Progress Notes (Signed)
Patient ID: Leslie Burns, female   DOB: [birth year], 82 y.o.   MRN: [id number] Presents for pessary maintenance. Has a Gellhorn pessary states is unable to remove. Has had good relief of cystocele, able to urinate without problem. Denies any problems today. Delivered 8 children vaginally. Helping to care for husband who has Parkinsons and dementia.  Exam: Appears well. External genitalia within normal limits, Gellhorn pessary removed washed, speculum exam no areas of erythema or abrasion. +2 cystocele Gellhorn replaced.  Pessary maintenance  Plan: Return in 3 months for pessary maintenance.

## 2016-01-28 ENCOUNTER — Ambulatory Visit (INDEPENDENT_AMBULATORY_CARE_PROVIDER_SITE_OTHER): Payer: PPO | Admitting: Family Medicine

## 2016-01-28 ENCOUNTER — Encounter: Payer: Self-pay | Admitting: Family Medicine

## 2016-01-28 VITALS — BP 138/70 | HR 54 | Temp 98.0°F | Ht 66.5 in | Wt 156.6 lb

## 2016-01-28 DIAGNOSIS — R739 Hyperglycemia, unspecified: Secondary | ICD-10-CM

## 2016-01-28 DIAGNOSIS — I471 Supraventricular tachycardia: Secondary | ICD-10-CM

## 2016-01-28 DIAGNOSIS — Z0000 Encounter for general adult medical examination without abnormal findings: Secondary | ICD-10-CM | POA: Diagnosis not present

## 2016-01-28 DIAGNOSIS — E785 Hyperlipidemia, unspecified: Secondary | ICD-10-CM

## 2016-01-28 DIAGNOSIS — F320 Major depressive disorder, single episode, mild: Secondary | ICD-10-CM | POA: Diagnosis not present

## 2016-01-28 DIAGNOSIS — I10 Essential (primary) hypertension: Secondary | ICD-10-CM

## 2016-01-28 DIAGNOSIS — E049 Nontoxic goiter, unspecified: Secondary | ICD-10-CM | POA: Diagnosis not present

## 2016-01-28 DIAGNOSIS — R1310 Dysphagia, unspecified: Secondary | ICD-10-CM

## 2016-01-28 LAB — LIPID PANEL
CHOL/HDL RATIO: 5
Cholesterol: 248 mg/dL — ABNORMAL HIGH (ref 0–200)
HDL: 54.9 mg/dL (ref >39.00)
LDL CALC: 172 mg/dL — AB (ref 0–99)
NONHDL: 192.91
TRIGLYCERIDES: 104 mg/dL (ref 0.0–149.0)
VLDL: 20.8 mg/dL (ref 0.0–40.0)

## 2016-01-28 LAB — BASIC METABOLIC PANEL
BUN: 19 mg/dL (ref 6–23)
CALCIUM: 9.2 mg/dL (ref 8.4–10.5)
CO2: 30 mEq/L (ref 19–32)
Chloride: 104 mEq/L (ref 96–112)
Creatinine, Ser: 0.69 mg/dL (ref 0.40–1.20)
GFR: 86.77 mL/min (ref >60.00)
Glucose, Bld: 86 mg/dL (ref 70–99)
Potassium: 4.1 mEq/L (ref 3.5–5.1)
Sodium: 142 mEq/L (ref 135–145)

## 2016-01-28 LAB — HEMOGLOBIN A1C: Hgb A1c MFr Bld: 5.9 % (ref 4.6–6.5)

## 2016-01-28 LAB — TSH: TSH: 0.58 u[IU]/mL (ref 0.35–4.50)

## 2016-01-28 NOTE — Patient Instructions (Signed)
Before you leave: -Labs -Schedule follow-up in 4-6 months  -We placed a referral for you as discussed to the Ear, Nose and throat specialist regarding the difficulty swallowing pills and follow up on the enlarged thyroid. It usually takes about 1-2 weeks to process and schedule this referral. If you have not heard from [place] regarding this appointment in 2 weeks please contact our office. Easy care immediately if difficulty swallowing is worsening.  We recommend the following healthy lifestyle measures: - eat a healthy whole foods diet consisting of regular small meals composed of vegetables, fruits, beans, nuts, seeds, healthy meats such as white chicken and fish and whole grains.  - avoid sweets, white starchy foods, fried foods, fast food, processed foods, sodas, red meet and other fattening foods.  - get a least 150-300 minutes of aerobic exercise per week.   -We have ordered labs or studies at this visit. It can take up to 1-2 weeks for results and processing. We will contact you with instructions IF your results are abnormal. Normal results will be released to your [facility]. If you have not heard from [place] or can not find your results in [facility] in 2 weeks please contact our office.

## 2016-01-28 NOTE — Progress Notes (Signed)
Medicare Annual Preventive Care Visit  (initial annual wellness or annual wellness exam)  Concerns and/or follow up today:  HTN/PVCs/SVT:  -on atenolol, hctz  -denies: CP, SOB, swelling  -Reports exercises and experiences no symptoms with exercise  HLD/Prediabetes:  -she has refused medication for this - today reports she would like to check labs as it is helpful in motivating her lifestyle changes -denies: polyuria, polydipsia, vision changes  Thyroid Goiter/Elevated TSH/Normal free T4:  -evaluated by [facility]NT 12/3013, thought needed biopsy, then when went for biopsy apparently Dr. Joya San felt did not need biopsy and advised yearly [place] and TSH -Per report, stable 05/2015 [place] -She has developed some difficulty swallowing larger pills, this happens daily when she tries to swallow her calcium pills and is new for her -reports no increased swelling of throat, hot/or cold flashes, fatigue, malaise   OA knees:  -on glucosmine-chondrointin  Vertigo - told atypical migraine: -dx > 25 years ago -intermittent - maybe a few episodes per year -takes meclizine and anti-emetic ODT  Mild depressed mood: -Situational, related to caring for her husband with dementia -Reports strong faith and strong support system through her face -Denies hopelessness, suicidal ideation, psychosis -Not currently interested in counseling or medications for this   ROS: negative for report of fevers, unintentional weight loss, vision changes, vision loss, hearing loss or change, chest pain, sob, hemoptysis, melena, hematochezia, hematuria, genital discharge or lesions, falls, bleeding or bruising, loc, thoughts of suicide or self harm, memory loss  1.) Patient-completed health risk assessment  - completed and reviewed, see scanned documentation  2.) Review of Medical History: -PMH, PSH, Family History and current specialty and care providers reviewed and updated and listed below  - see scanned in  document in chart and below  Past Medical History  Diagnosis Date  . Chicken pox   . Seasonal allergies   . Hypertension     high readings  . High cholesterol   . Ovarian cyst 2007  . Vertigo   . SVT (supraventricular tachycardia) ([place])   . Neuromuscular disorder ([place])     numbness&tingling arms &hands  . PONV (postoperative nausea and vomiting)     everytime, always sick with anesth.   . Arthritis     "both knees" (01/22/2013)  . Basal cell carcinoma of back ` 1980's  . Cystocele or rectocele with uterine prolapse 09/05/2013  . Atypical migraine with veritgo 08/16/2014    diagnosed >25 years ago per her report, treats with meclizine and anti-emetic - suppository or odt   . Enlarged thyroid, s/p eval by ENT in 11/2013, no nodule required biopsy and annual [place] and TSH advised 01/16/2014    Past Surgical History  Procedure Laterality Date  . Tonsillectomy and adenoidectomy  1945  . Combined hysteroscopy diagnostic / d&c  1958  . Combined hysteroscopy diagnostic / d&c  1972    tubal pregnancy  . Cyst excision      ovarian  . Total knee arthroplasty Right 01/22/2013  . Dilation and curettage of uterus    . Cataract extraction w/ intraocular lens  implant, bilateral Bilateral 2012  . Laparoscopic ovarian cystectomy  ~ 2006  . Av node ablation  1995  . Basal cell carcinoma excision  ?1980's    "off my back" (01/22/2013)  . Total knee arthroplasty Right 01/22/2013    Procedure: TOTAL KNEE ARTHROPLASTY;  Surgeon: Vickey Huger, MD;  Location: [place];  Service: Orthopedics;  Laterality: Right;    Social History   Social  History  . Marital Status: Married    Spouse Name: N/A  . Number of Children: N/A  . Years of Education: N/A   Occupational History  . Not on file.   Social History Main Topics  . Smoking status: Never Smoker   . Smokeless tobacco: Never Used  . Alcohol Use: No  . Drug Use: No  . Sexual Activity: No   Other Topics Concern  . Not on file   Social History  Narrative   Work or School: homemaker      Home Situation: lives with husband who has parkinson and dementia - she is the sole caregiver      Spiritual Beliefs: christian      Lifestyle: was exercising until the summer - walks about 1/3 of a mile on a regular basis, only limited by knee pain, can walk of a flight of steps without any SOB             Family History  Problem Relation Age of Onset  . Stroke Mother 27  . Heart disease Father 52    MI  . Alcohol abuse      parent  . Alcohol abuse      grandparent  . Arthritis      parent  . Hyperlipidemia      parent  . Hypertension      parent    Current Outpatient Prescriptions on File Prior to Visit  Medication Sig Dispense Refill  . Ascorbic Acid (VITAMIN C PO) Take 1 tablet by mouth daily.    Marland Kitchen atenolol (TENORMIN) 25 MG tablet Take 1 tablet (25 mg total) by mouth daily. 90 tablet 3  . calcium citrate (CALCITRATE - DOSED IN MG ELEMENTAL CALCIUM) 950 MG tablet Take 1 tablet by mouth daily.    . Cholecalciferol (VITAMIN D3) 5000 UNITS TABS Take 1 tablet by mouth daily.    . Coenzyme Q10 (CO Q 10) 100 MG CAPS Take 1 capsule by mouth daily.    . Glucosamine-Chondroitin-MSM (TRIPLE FLEX PO) Take 2 capsules by mouth daily.    . hydrochlorothiazide (HYDRODIURIL) 25 MG tablet Take 1 tablet (25 mg total) by mouth daily. (Patient taking differently: Take 12.5 mg by mouth daily. ) 90 tablet 3  . meclizine (ANTIVERT) 25 MG tablet TAKE ONE-HALF TABLET BY MOUTH THREE TIMES DAILY AS NEEDED FOR DIZZINESS 30 tablet 0  . NONFORMULARY OR COMPOUNDED ITEM Estradiol .02% 1 ML Prefilled Applicator Sig: apply vaginally twice a week #90 Day Supply with 4 refills 1 each 4  . Nutritional Supplements (JUICE PLUS FIBRE PO) Take 2 tablets by mouth daily.     No current facility-administered medications on file prior to visit.     3.) Review of functional ability and level of safety:  Any difficulty hearing?  NO  History of falling?   NO  Any trouble with IADLs - using a phone, using transportation, grocery shopping, preparing meals, doing housework, doing laundry, taking medications and managing money? NO  Advance Directives? NO  See summary of recommendations in Patient Instructions below.  4.) Physical Exam Filed Vitals:   01/28/16 0950  BP: 138/70  Pulse: 54  Temp: 98 F (36.7 C)   Estimated body mass index is 24.9 kg/(m^2) as calculated from the following:   Height as of this encounter: 5' 6.5" (1.689 m).   Weight as of this encounter: 156 lb 9.6 oz (71.033 kg).  EKG (optional): deferred  General: alert, appear well hydrated and in no acute  distress  HEENT: visual acuity grossly intact  NECK: general nodular thyroid enlargement appreciable by visual and palpatory exam - approx 6x4cm in diameter  CV: HRRR  Lungs: CTA bilaterally  Psych: pleasant and cooperative, no obvious depression or anxiety  Cognitive function grossly intact  See patient instructions for recommendations.  Education and counseling regarding the above review of health provided with a plan for the following: -see scanned patient completed form for further details -fall prevention strategies discussed  -healthy lifestyle discussed -importance and resources for completing advanced directives discussed -see patient instructions below for any other recommendations provided  4)The following written screening schedule of preventive measures were reviewed with assessment and plan made per below, orders and patient instructions:      AAA screening done if applicable     Alcohol screening done     Obesity Screening and counseling done     STI screening (Hep C if born 65-65) offered and per pt wishes     Tobacco Screening done done       Pneumococcal (PPSV23 -one dose after 64, one before if risk factors), influenza yearly and hepatitis B vaccines (if high risk - end stage renal disease, IV drugs, homosexual men, live in home  for mentally retarded, hemophilia receiving factors) ASSESSMENT/PLAN: done if applicable      Screening mammograph (yearly if >40) ASSESSMENT/PLAN: utd or ordered      Screening Pap smear/pelvic exam (q2 years) ASSESSMENT/PLAN: n/a, declined      Prostate cancer screening ASSESSMENT/PLAN: n/a, declined      Colorectal cancer screening (FOBT yearly or flex sig q4y or colonoscopy q10y or barium enema q4y) ASSESSMENT/PLAN: utd or ordered      Diabetes outpatient self-management training services ASSESSMENT/PLAN: utd or done      Bone mass measurements(covered q2y if indicated - estrogen def, osteoporosis, hyperparathyroid, vertebral abnormalities, osteoporosis or steroids) ASSESSMENT/PLAN: utd or discussed and ordered per pt wishes      Screening for glaucoma(q1y if high risk - diabetes, FH, AA and > 50 or hispanic and > 65) ASSESSMENT/PLAN: utd or advised      Medical nutritional therapy for individuals with diabetes or renal disease ASSESSMENT/PLAN: see orders      Cardiovascular screening blood tests (lipids q5y) ASSESSMENT/PLAN: see orders and labs      Diabetes screening tests ASSESSMENT/PLAN: see orders and labs   7.) Summary:  Medicare annual wellness visit, subsequent -risk factors and conditions per above assessment were discussed and treatment, recommendations and referrals were offered per documentation above and orders and patient instructions.  Mild single current episode of major depressive disorder (HCC) -Stable, declines any medical treatment with therapy or medications -Denies psychosis or thoughts of self-harm -Advised prompt evaluation if worsening  Essential hypertension - Plan: Basic metabolic panel SVT (supraventricular tachycardia) (HCC) -Stable  -Continue current medications and check labs today   Hyperlipemia - Plan: Lipid Panel -Stable, we have discussed risk and benefits of various treatment options  -She has refused any medication type  treatment for this  -She does wish to check labs as she feels is helpful in motivating her with lifestyle changes but again reports she will not take medications   Hyperglycemia - Plan: Hemoglobin A1c -Labs, lifestyle recommendations   Enlarged thyroid, s/p eval by ENT in 11/2013, no nodule required biopsy and annual [place] and TSH advised - Plan: TSH, Ambulatory referral to ENT -Multinodular goiter, evaluated by ENT in the past, never had biopsy as radiologist felt was not needed  -  Now with reported pill dysphasia daily  -Advised reevaluation with specialist and referral placed  -Advised prompt reevaluation if worsening dysphagia   Pill dysphagia - Plan: Ambulatory referral to ENT  Patient Instructions  Before you leave: -Labs -Schedule follow-up in 4-6 months  -We placed a referral for you as discussed to the Ear, Nose and throat specialist regarding the difficulty swallowing pills and follow up on the enlarged thyroid. It usually takes about 1-2 weeks to process and schedule this referral. If you have not heard from [place] regarding this appointment in 2 weeks please contact our office. Easy care immediately if difficulty swallowing is worsening.  We recommend the following healthy lifestyle measures: - eat a healthy whole foods diet consisting of regular small meals composed of vegetables, fruits, beans, nuts, seeds, healthy meats such as white chicken and fish and whole grains.  - avoid sweets, white starchy foods, fried foods, fast food, processed foods, sodas, red meet and other fattening foods.  - get a least 150-300 minutes of aerobic exercise per week.   -We have ordered labs or studies at this visit. It can take up to 1-2 weeks for results and processing. We will contact you with instructions IF your results are abnormal. Normal results will be released to your [facility]. If you have not heard from [place] or can not find your results in [facility] in 2 weeks please contact our  office.

## 2016-01-28 NOTE — Progress Notes (Signed)
Pre visit review using our clinic review tool, if applicable. No additional management support is needed unless otherwise documented below in the visit note. 

## 2016-01-30 ENCOUNTER — Encounter: Payer: Self-pay | Admitting: *Deleted

## 2016-04-19 ENCOUNTER — Other Ambulatory Visit: Payer: Self-pay | Admitting: Family Medicine

## 2016-04-20 ENCOUNTER — Ambulatory Visit: Payer: PPO | Admitting: Women's Health

## 2016-04-30 ENCOUNTER — Ambulatory Visit (INDEPENDENT_AMBULATORY_CARE_PROVIDER_SITE_OTHER): Payer: PPO | Admitting: Women's Health

## 2016-04-30 ENCOUNTER — Encounter: Payer: Self-pay | Admitting: Women's Health

## 2016-04-30 VITALS — BP 122/68

## 2016-04-30 DIAGNOSIS — Z4689 Encounter for fitting and adjustment of other specified devices: Secondary | ICD-10-CM | POA: Diagnosis not present

## 2016-04-30 NOTE — Progress Notes (Signed)
Patient ID: Leslie Burns, female   DOB: 1933/05/05, 82 y.o.   MRN: [id number] Presents for pessary maintenance. Has a Gellhorn 2 3/4 with good relief of uterine prolapse +1 cystocele/ rectocele. Cares for husband with Parkinson's declines surgical intervention. Without complaint today. Planning to move to [facility] in August and reports being able to remove and replace in the past but no longer can. Pessary becoming more difficult to remove with each visit. Reports using a ring with support pessary in the past has had better relief of symptoms with Gellhorn.  Exam: Appears well. Difficulty removing Gellhorn pessary, speculum exam no visible erosion, erythema or lesions. Gellhorn pessary washed and replaced without difficulty.  Pessary maintenance  Plan: We'll order Gellhorn 2-1/2 size.

## 2016-06-10 ENCOUNTER — Ambulatory Visit (INDEPENDENT_AMBULATORY_CARE_PROVIDER_SITE_OTHER): Payer: PPO | Admitting: Women's Health

## 2016-06-10 ENCOUNTER — Encounter: Payer: Self-pay | Admitting: Women's Health

## 2016-06-10 VITALS — BP 126/82

## 2016-06-10 DIAGNOSIS — Z4689 Encounter for fitting and adjustment of other specified devices: Secondary | ICD-10-CM

## 2016-06-10 NOTE — Progress Notes (Signed)
Presents for insertion of new pessary, had a Gellhorn 2-3/4, was becoming more difficult to get in and out, but had good relief of prolapse. Gellhorn 2-1/2 was ordered and will be placed today. Has no difficulty urinating. Has had 8 vaginal deliveries, helps care for her husband with dementia and  Parkinson's.  Exam: Appears well. External genitalia within normal limits, Gellhorn 2-3/4 removed with some difficulty, Gellhorn 2-1/2 was placed,  Pessary maintenance  Plan: Return in 3 months for pessary maintenance or if problems.

## 2016-07-28 ENCOUNTER — Ambulatory Visit: Payer: PPO | Admitting: Family Medicine

## 2016-07-30 ENCOUNTER — Ambulatory Visit: Payer: PPO | Admitting: Family Medicine

## 2017-03-09 ENCOUNTER — Encounter: Payer: Self-pay | Admitting: Gynecology
